# Patient Record
Sex: Male | Born: 1965 | Race: White | Hispanic: No | Marital: Married | Smoking: Never smoker
Health system: Southern US, Community
[De-identification: ages and names within clinical notes are randomized; demographics above are authoritative.]

## PROBLEM LIST (undated history)

## (undated) DIAGNOSIS — K219 Gastro-esophageal reflux disease without esophagitis: Secondary | ICD-10-CM

## (undated) DIAGNOSIS — G4730 Sleep apnea, unspecified: Secondary | ICD-10-CM

## (undated) DIAGNOSIS — I10 Essential (primary) hypertension: Secondary | ICD-10-CM

## (undated) DIAGNOSIS — E119 Type 2 diabetes mellitus without complications: Secondary | ICD-10-CM

## (undated) DIAGNOSIS — I209 Angina pectoris, unspecified: Secondary | ICD-10-CM

## (undated) DIAGNOSIS — M1990 Unspecified osteoarthritis, unspecified site: Secondary | ICD-10-CM

## (undated) HISTORY — PX: APPENDECTOMY: SHX54

## (undated) HISTORY — PX: BACK SURGERY: SHX140

## (undated) HISTORY — PX: SHOULDER SURGERY: SHX246

## (undated) HISTORY — PX: TONSILLECTOMY: SUR1361

## (undated) HISTORY — PX: JOINT REPLACEMENT: SHX530

## (undated) HISTORY — PX: OTHER SURGICAL HISTORY: SHX169

---

## 2002-11-19 ENCOUNTER — Emergency Department (HOSPITAL_COMMUNITY): Admission: EM | Admit: 2002-11-19 | Discharge: 2002-11-19 | Payer: Self-pay | Admitting: Emergency Medicine

## 2002-11-21 ENCOUNTER — Emergency Department (HOSPITAL_COMMUNITY): Admission: EM | Admit: 2002-11-21 | Discharge: 2002-11-21 | Payer: Self-pay | Admitting: Emergency Medicine

## 2002-11-26 ENCOUNTER — Emergency Department (HOSPITAL_COMMUNITY): Admission: EM | Admit: 2002-11-26 | Discharge: 2002-11-26 | Payer: Self-pay | Admitting: Emergency Medicine

## 2006-05-29 ENCOUNTER — Emergency Department (HOSPITAL_COMMUNITY): Admission: EM | Admit: 2006-05-29 | Discharge: 2006-05-29 | Payer: Self-pay | Admitting: Emergency Medicine

## 2006-07-23 ENCOUNTER — Ambulatory Visit (HOSPITAL_COMMUNITY): Admission: RE | Admit: 2006-07-23 | Discharge: 2006-07-23 | Payer: Self-pay | Admitting: Specialist

## 2007-07-09 ENCOUNTER — Ambulatory Visit (HOSPITAL_COMMUNITY): Admission: RE | Admit: 2007-07-09 | Discharge: 2007-07-10 | Payer: Self-pay | Admitting: Specialist

## 2009-07-27 ENCOUNTER — Ambulatory Visit (HOSPITAL_COMMUNITY): Admission: RE | Admit: 2009-07-27 | Discharge: 2009-07-27 | Payer: Self-pay | Admitting: Specialist

## 2009-09-30 ENCOUNTER — Ambulatory Visit (HOSPITAL_COMMUNITY): Admission: RE | Admit: 2009-09-30 | Discharge: 2009-10-01 | Payer: Self-pay | Admitting: Specialist

## 2010-03-21 ENCOUNTER — Inpatient Hospital Stay (HOSPITAL_COMMUNITY)
Admission: RE | Admit: 2010-03-21 | Discharge: 2010-03-22 | Payer: Self-pay | Source: Home / Self Care | Attending: Orthopedic Surgery | Admitting: Orthopedic Surgery

## 2010-05-24 NOTE — Op Note (Signed)
  NAME:  Frank Hensley, Frank Hensley NO.:  [id number]  MEDICAL RECORD NO.:  [id number]           PATIENT TYPE:  LOCATION:                                 FACILITY:  PHYSICIAN:  Erasmo Leventhal, M.D.DATE OF BIRTH:  [birth year]  DATE OF PROCEDURE: DATE OF DISCHARGE:                              OPERATIVE REPORT   ADDENDUM  Addendum to a surgical note, I dictated on July 2011.  After review of the operative report, there was an air.  Medial side was inspected and there was a posterior horn tear of his medial meniscus. This was debrided arthroscopically with basket and motorized shaver.          ______________________________ Erasmo Leventhal, M.D.     RAC/MEDQ  D:  05/10/2010  T:  05/11/2010  Job:  [id number]  Electronically Signed by Eugenia Mcalpine M.D. on 05/24/2010 05:18:08 PM

## 2010-06-05 LAB — GLUCOSE, CAPILLARY
Glucose-Capillary: 115 mg/dL — ABNORMAL HIGH (ref 70–99)
Glucose-Capillary: 125 mg/dL — ABNORMAL HIGH (ref 70–99)

## 2010-06-05 LAB — CBC
HCT: 34.9 % — ABNORMAL LOW (ref 39.0–52.0)
HCT: 39.7 % (ref 39.0–52.0)
Hemoglobin: 11.8 g/dL — ABNORMAL LOW (ref 13.0–17.0)
Hemoglobin: 13.6 g/dL (ref 13.0–17.0)
MCH: 28.4 pg (ref 26.0–34.0)
MCHC: 33.8 g/dL (ref 30.0–36.0)
WBC: 6.4 10*3/uL (ref 4.0–10.5)

## 2010-06-05 LAB — URINALYSIS, ROUTINE W REFLEX MICROSCOPIC
Bilirubin Urine: NEGATIVE
Hgb urine dipstick: NEGATIVE
Urobilinogen, UA: 0.2 mg/dL (ref 0.0–1.0)
pH: 7 (ref 5.0–8.0)

## 2010-06-05 LAB — DIFFERENTIAL
Basophils Relative: 0 % (ref 0–1)
Eosinophils Absolute: 0.2 10*3/uL (ref 0.0–0.7)
Eosinophils Relative: 3 % (ref 0–5)
Monocytes Absolute: 0.6 10*3/uL (ref 0.1–1.0)
Monocytes Relative: 9 % (ref 3–12)
Neutro Abs: 4 10*3/uL (ref 1.7–7.7)
Neutrophils Relative %: 62 % (ref 43–77)

## 2010-06-05 LAB — BASIC METABOLIC PANEL
Calcium: 9.2 mg/dL (ref 8.4–10.5)
Chloride: 106 mEq/L (ref 96–112)
Creatinine, Ser: 1.02 mg/dL (ref 0.4–1.5)
GFR calc Af Amer: >60 mL/min (ref >60)
GFR calc non Af Amer: >60 mL/min (ref >60)
GFR calc non Af Amer: >60 mL/min (ref >60)
Glucose, Bld: 126 mg/dL — ABNORMAL HIGH (ref 70–99)
Potassium: 3.8 mEq/L (ref 3.5–5.1)
Potassium: 4.4 mEq/L (ref 3.5–5.1)
Sodium: 139 mEq/L (ref 135–145)
Sodium: 140 mEq/L (ref 135–145)

## 2010-06-05 LAB — TYPE AND SCREEN: Antibody Screen: NEGATIVE

## 2010-06-05 LAB — SURGICAL PCR SCREEN: Staphylococcus aureus: POSITIVE — AB

## 2010-06-11 LAB — SURGICAL PCR SCREEN: MRSA, PCR: NEGATIVE

## 2010-06-11 LAB — DIFFERENTIAL
Basophils Relative: 1 % (ref 0–1)
Eosinophils Absolute: 0.1 10*3/uL (ref 0.0–0.7)
Monocytes Absolute: 0.6 10*3/uL (ref 0.1–1.0)
Monocytes Relative: 9 % (ref 3–12)

## 2010-06-11 LAB — COMPREHENSIVE METABOLIC PANEL
ALT: 32 U/L (ref 0–53)
Albumin: 4 g/dL (ref 3.5–5.2)
Alkaline Phosphatase: 85 U/L (ref 39–117)
GFR calc Af Amer: >60 mL/min (ref >60)
Potassium: 3.8 mEq/L (ref 3.5–5.1)
Sodium: 140 mEq/L (ref 135–145)
Total Protein: 7.2 g/dL (ref 6.0–8.3)

## 2010-06-11 LAB — URINALYSIS, ROUTINE W REFLEX MICROSCOPIC
Glucose, UA: NEGATIVE mg/dL
Nitrite: NEGATIVE
Protein, ur: NEGATIVE mg/dL
pH: 5.5 (ref 5.0–8.0)

## 2010-06-11 LAB — CBC
Platelets: 169 10*3/uL (ref 150–400)
RDW: 13.5 % (ref 11.5–15.5)
WBC: 6.4 10*3/uL (ref 4.0–10.5)

## 2010-06-11 LAB — APTT: aPTT: 26 seconds (ref 24–37)

## 2010-06-11 LAB — GLUCOSE, CAPILLARY: Glucose-Capillary: 100 mg/dL — ABNORMAL HIGH (ref 70–99)

## 2010-06-13 LAB — COMPREHENSIVE METABOLIC PANEL
Albumin: 3.8 g/dL (ref 3.5–5.2)
Alkaline Phosphatase: 113 U/L (ref 39–117)
BUN: 11 mg/dL (ref 6–23)
Calcium: 9.2 mg/dL (ref 8.4–10.5)
Creatinine, Ser: 1.11 mg/dL (ref 0.4–1.5)
Glucose, Bld: 320 mg/dL — ABNORMAL HIGH (ref 70–99)
Potassium: 4 mEq/L (ref 3.5–5.1)
Total Protein: 7.1 g/dL (ref 6.0–8.3)

## 2010-06-13 LAB — DIFFERENTIAL
Lymphocytes Relative: 26 % (ref 12–46)
Lymphs Abs: 1.4 10*3/uL (ref 0.7–4.0)
Monocytes Absolute: 0.5 10*3/uL (ref 0.1–1.0)
Monocytes Relative: 9 % (ref 3–12)
Neutro Abs: 3.4 10*3/uL (ref 1.7–7.7)
Neutrophils Relative %: 63 % (ref 43–77)

## 2010-06-13 LAB — APTT: aPTT: 26 seconds (ref 24–37)

## 2010-06-13 LAB — URINALYSIS, ROUTINE W REFLEX MICROSCOPIC
Bilirubin Urine: NEGATIVE
Leukocytes, UA: NEGATIVE
Nitrite: NEGATIVE
Specific Gravity, Urine: 1.031 — ABNORMAL HIGH (ref 1.005–1.030)
Urobilinogen, UA: 0.2 mg/dL (ref 0.0–1.0)
pH: 5.5 (ref 5.0–8.0)

## 2010-06-13 LAB — CBC
HCT: 41.1 % (ref 39.0–52.0)
MCHC: 34.3 g/dL (ref 30.0–36.0)
Platelets: 150 10*3/uL (ref 150–400)
RDW: 13.8 % (ref 11.5–15.5)

## 2010-06-13 LAB — PROTIME-INR: INR: 1.01 (ref 0.00–1.49)
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
## 2010-08-11 NOTE — Op Note (Signed)
NAME:  Frank Hensley, Frank Hensley NO.:  [id number]   MEDICAL RECORD NO.:  [id number]          PATIENT TYPE:  AMB   LOCATION:  DAY                          FACILITY:  [facility]   PHYSICIAN:  Erasmo Leventhal, M.D.DATE OF BIRTH:  November 21, 1965   DATE OF PROCEDURE:  07/23/2006  DATE OF DISCHARGE:                               OPERATIVE REPORT   PREOPERATIVE DIAGNOSIS:  Left knee probable torn meniscus,  chondromalacia, possible loose bodies.   POSTOPERATIVE DIAGNOSIS:  Left knee medial meniscal tear, chondral loose  bodies, grade III chondromalacia, patellofemoral joint, grade III  femoral condyle, and grade 3-4 lateral compartment.   PROCEDURE:  Left knee arthroscopic posterior medial meniscectomy,  chondroplasty of patellofemoral joint, chondroplasty of the medial  compartment, chondroplasty of the lateral compartment, removal of  multiple chondral loose bodies.   SURGEON:  Erasmo Leventhal, M.D.   ASSISTANT:  Jaquelyn Bitter. Chabon, PA-C.   ANESTHESIA:  Knee block with monitored anesthesia care, Dr. Pollyann Kennedy.   ESTIMATED BLOOD LOSS:  Less than 10 cc.   DRAINS:  None.   COMPLICATIONS:  None.   TOURNIQUET TIME:  None.   DISPOSITION:  PACU stable.   OPERATIVE DETAILS:  Patient counseled in the holding area.  The correct  side was identified.  IV was started.  Antibiotics were given.  Local  block was administered.  Patient was taken to the operating room, placed  in supine position.  Monitored anesthesia care.  Prepped with DuraPrep.  Draped in a sterile fashion.  Anesthetized.  I added 20 cc of 1%  lidocaine with epinephrine to the local knee block.  To supplement, he  was given monitored anesthesia care.  A medial portal was established.  Anteromedial and anterolateral.  Diagnostic arthroscopy revealed  multiple chondral loose bodies.  These were sequentially removed.  The  patellofemoral joint revealed normal tracking with a grade III  chondromalacia.  Mechanical  chondroplasty was performed with a  mechanical shaver back to a stable base.  ACL and PCL were intact.  The  lateral side was inspected.  The meniscus was intact.  A grade 3-4  chondromalacia to the lateral tibial plateau and a grade 3-4 lateral  femoral condyle.  Mechanical chondroplasty was performed back to stable  base.  The medial side was inspected.  Grade 3 chondromalacia to the  medial femoral condyle, chondroplasty back to stable base.  Medial  meniscal tear was present.  Utilizing motorized shaver, partial medial  meniscectomy was performed back to stable base, well-beveled and  contoured.  Then took the [employer] Mini Vac system, gently coagulated  the periphery, being careful to touch the meniscus only and not the  articular cartilage.  The knee was then sequentially reinspected.  There  were no other abnormalities noted.  After copious irrigation, all  arthroscopic equipment was removed.  The three portals were closed with  4-0 nylon suture.  At the end of the case, 20 cc of 1.25% Marcaine with  epinephrine and 4 mg of sulfate was injected into the knee for pain with  hemostasis.  Sterile dressing applied to the knee.  He tolerated the  procedure with no complications.  He was gently awakened and taken to  the operating room and PACU in stable condition.   To help with surgical technique and leg holding due to the patient's  morbid obesity, Mr. Leilani Able, PA-C's, assistance, was needed  throughout the entire case.           ______________________________  Erasmo Leventhal, M.D.     RAC/MEDQ  D:  07/23/2006  T:  07/24/2006  Job:  [id number]

## 2010-12-19 LAB — COMPREHENSIVE METABOLIC PANEL
ALT: 32
AST: 26
Albumin: 3.7
CO2: 26
Calcium: 9.3
Chloride: 108
Creatinine, Ser: 0.97
GFR calc Af Amer: >60
Sodium: 141

## 2010-12-19 LAB — CBC
HCT: 36.4 — ABNORMAL LOW
MCHC: 35.1
MCV: 81.1
MCV: 80.6
Platelets: 185
RBC: 5.06
RBC: 4.52
WBC: 5.8
WBC: 7

## 2010-12-19 LAB — URINALYSIS, ROUTINE W REFLEX MICROSCOPIC
Glucose, UA: 100 — AB
Nitrite: NEGATIVE
Specific Gravity, Urine: 1.015
pH: 6

## 2010-12-19 LAB — DIFFERENTIAL
Eosinophils Absolute: 0.2
Eosinophils Relative: 3
Lymphocytes Relative: 26
Lymphs Abs: 1.5
Monocytes Absolute: 0.5

## 2010-12-19 LAB — BASIC METABOLIC PANEL
Chloride: 107
GFR calc Af Amer: >60
GFR calc non Af Amer: >60
Potassium: 3.5
Sodium: 140

## 2011-08-24 ENCOUNTER — Other Ambulatory Visit: Payer: Self-pay | Admitting: Orthopedic Surgery

## 2011-08-24 ENCOUNTER — Ambulatory Visit
Admission: RE | Admit: 2011-08-24 | Discharge: 2011-08-24 | Disposition: A | Discharge status: Home / Self Care | Payer: Worker's Compensation | Source: Ambulatory Visit | Attending: Orthopedic Surgery | Admitting: Orthopedic Surgery

## 2011-08-24 DIAGNOSIS — M25569 Pain in unspecified knee: Secondary | ICD-10-CM

## 2013-12-03 ENCOUNTER — Ambulatory Visit: Payer: Self-pay | Admitting: Orthopedic Surgery

## 2013-12-11 ENCOUNTER — Ambulatory Visit: Payer: Self-pay | Admitting: Orthopedic Surgery

## 2013-12-18 ENCOUNTER — Encounter (HOSPITAL_COMMUNITY): Payer: Self-pay | Admitting: Pharmacy Technician

## 2013-12-22 ENCOUNTER — Other Ambulatory Visit (HOSPITAL_COMMUNITY): Payer: Self-pay | Admitting: *Deleted

## 2013-12-22 NOTE — Patient Instructions (Addendum)
20     Your procedure is scheduled on:  Wednesday 12/30/2013  [facility] and follow signs to Short Stay  at  0630 AM.  Call this number if you have problems the night before or morning of surgery:  [phone]   Remember: DO NOT TAKE ANY DIABETIC MEDICATIONS MORNING OF SURGERY!          Do not eat food or drink liquids AFTER MIDNIGHT!  Take these medicines the morning of surgery with A SIP OF WATER: Gabapentin    [facility] IS NOT RESPONSIBLE FOR ANY BELONGINGS OR VALUABLES BROUGHT TO HOSPITAL.  Marland Kitchen.  Leave suitcase in the car. After surgery it may be brought to your room.  For patients admitted to the hospital, checkout time is 11:00 AM the day of              Discharge.    DO NOT WEAR  JEWELRY,MAKE-UP,LOTIONS,POWDERS,PERFUMES,CONTACTS , DENTURES OR BRIDGEWORK ,AND DO NOT WEAR FALSE EYELASHES                                    Patients discharged the day of surgery will not be allowed to drive home.  If going home the same day of surgery, must have someone stay with you  first 24 hrs.at home and arrange for someone to drive you home from the Hospital.                         YOUR DRIVER IS: N/A   Special Instructions:              Please read over the following fact sheets that you were given:             1. [facility] PREPARING FOR SURGERY SHEET              2.[facility]                                                        Center - Preparing for Surgery Before surgery, you can play an important role.  Because skin is not sterile, your skin needs to be as free of germs as possible.  You can reduce the number of germs on your skin by washing with CHG (chlorahexidine gluconate) soap before surgery.  CHG is an antiseptic cleaner which kills germs and bonds with the skin to continue killing germs even after washing. Please DO NOT use if you have an allergy to CHG or antibacterial soaps.  If your skin becomes reddened/irritated stop using  the CHG and inform your nurse when you arrive at Short Stay. Do not shave (including legs and underarms) for at least 48 hours prior to the first CHG shower.  You may shave your face/neck. Please follow these instructions carefully:  1.  Shower with CHG Soap the night before surgery and the  morning of Surgery.  2.  If you choose to wash your hair, wash your hair first as usual with your  normal  shampoo.  3.  After you shampoo, rinse your hair and body thoroughly to remove the  shampoo.  4.  Use CHG as you would any other liquid soap.  You can apply chg directly  to the skin and wash                       Gently with a scrungie or clean washcloth.  5.  Apply the CHG Soap to your body ONLY FROM THE NECK DOWN.   Do not use on face/ open                           Wound or open sores. Avoid contact with eyes, ears mouth and genitals (private parts).                       Wash face,  Genitals (private parts) with your normal soap.             6.  Wash thoroughly, paying special attention to the area where your surgery  will be performed.  7.  Thoroughly rinse your body with warm water from the neck down.  8.  DO NOT shower/wash with your normal soap after using and rinsing off  the CHG Soap.                9.  Pat yourself dry with a clean towel.            10.  Wear clean pajamas.            11.  Place clean sheets on your bed the night of your first shower and do not  sleep with pets. Day of Surgery : Do not apply any lotions/deodorants the morning of surgery.  Please wear clean clothes to the hospital/surgery center.  FAILURE TO FOLLOW THESE INSTRUCTIONS MAY RESULT IN THE CANCELLATION OF YOUR SURGERY PATIENT SIGNATURE_________________________________  NURSE SIGNATURE__________________________________  ________________________________________________________________________   Frank Hensley  An incentive spirometer is a tool that can help keep your lungs  clear and active. This tool measures how well you are filling your lungs with each breath. Taking long deep breaths may help reverse or decrease the chance of developing breathing (pulmonary) problems (especially infection) following:  A long period of time when you are unable to move or be active. BEFORE THE PROCEDURE   If the spirometer includes an indicator to show your best effort, your nurse or respiratory therapist will set it to a desired goal.  If possible, sit up straight or lean slightly forward. Try not to slouch.  Hold the incentive spirometer in an upright position. INSTRUCTIONS FOR USE  1. Sit on the edge of your bed if possible, or sit up as far as you can in bed or on a chair. 2. Hold the incentive spirometer in an upright position. 3. Breathe out normally. 4. Place the mouthpiece in your mouth and seal your lips tightly around it. 5. Breathe in slowly and as deeply as possible, raising the piston or the ball toward the top of the column. 6. Hold your breath for 3-5 seconds or for as long as possible. Allow the piston or ball to fall to the bottom of the column. 7. Remove the mouthpiece from your mouth and breathe out normally. 8. Rest for a few seconds and repeat Steps 1 through 7 at least 10 times every 1-2 hours when you are awake. Take your time and take a few normal breaths between deep breaths. 9. The spirometer may include an indicator to show  your best effort. Use the indicator as a goal to work toward during each repetition. 10. After each set of 10 deep breaths, practice coughing to be sure your lungs are clear. If you have an incision (the cut made at the time of surgery), support your incision when coughing by placing a pillow or rolled up towels firmly against it. Once you are able to get out of bed, walk around indoors and cough well. You may stop using the incentive spirometer when instructed by your caregiver.  RISKS AND COMPLICATIONS  Take your time so you do  not get dizzy or light-headed.  If you are in pain, you may need to take or ask for pain medication before doing incentive spirometry. It is harder to take a deep breath if you are having pain. AFTER USE  Rest and breathe slowly and easily.  It can be helpful to keep track of a log of your progress. Your caregiver can provide you with a simple table to help with this. If you are using the spirometer at home, follow these instructions: SEEK MEDICAL CARE IF:   You are having difficultly using the spirometer.  You have trouble using the spirometer as often as instructed.  Your pain medication is not giving enough relief while using the spirometer.  You develop fever of 100.5 F (38.1 C) or higher. SEEK IMMEDIATE MEDICAL CARE IF:   You cough up bloody sputum that had not been present before.  You develop fever of 102 F (38.9 C) or greater.  You develop worsening pain at or near the incision site. MAKE SURE YOU:   Understand these instructions.  Will watch your condition.  Will get help right away if you are not doing well or get worse. Document Released: 07/23/2006 Document Revised: 06/04/2011 Document Reviewed: 09/23/2006 [facility] Patient Information 2014 [place], [place].   ________________________________________________________________________

## 2013-12-23 ENCOUNTER — Encounter (INDEPENDENT_AMBULATORY_CARE_PROVIDER_SITE_OTHER): Payer: Self-pay

## 2013-12-23 ENCOUNTER — Ambulatory Visit (HOSPITAL_COMMUNITY)
Admission: RE | Admit: 2013-12-23 | Discharge: 2013-12-23 | Disposition: A | Discharge status: Home / Self Care | Payer: Worker's Compensation | Source: Ambulatory Visit | Attending: Orthopedic Surgery | Admitting: Orthopedic Surgery

## 2013-12-23 ENCOUNTER — Ambulatory Visit: Payer: Self-pay | Admitting: Orthopedic Surgery

## 2013-12-23 ENCOUNTER — Ambulatory Visit (HOSPITAL_COMMUNITY)
Admission: RE | Admit: 2013-12-23 | Discharge: 2013-12-23 | Disposition: A | Discharge status: Home / Self Care | Payer: Worker's Compensation | Source: Ambulatory Visit | Attending: Anesthesiology | Admitting: Anesthesiology

## 2013-12-23 ENCOUNTER — Encounter (HOSPITAL_COMMUNITY)
Admission: RE | Admit: 2013-12-23 | Discharge: 2013-12-23 | Disposition: A | Discharge status: Home / Self Care | Payer: Worker's Compensation | Source: Ambulatory Visit | Attending: Orthopedic Surgery | Admitting: Orthopedic Surgery

## 2013-12-23 ENCOUNTER — Encounter (HOSPITAL_COMMUNITY): Payer: Self-pay

## 2013-12-23 DIAGNOSIS — IMO0002 Reserved for concepts with insufficient information to code with codable children: Secondary | ICD-10-CM | POA: Diagnosis not present

## 2013-12-23 DIAGNOSIS — I10 Essential (primary) hypertension: Secondary | ICD-10-CM | POA: Diagnosis not present

## 2013-12-23 DIAGNOSIS — X58XXXA Exposure to other specified factors, initial encounter: Secondary | ICD-10-CM | POA: Diagnosis not present

## 2013-12-23 DIAGNOSIS — M5137 Other intervertebral disc degeneration, lumbosacral region: Secondary | ICD-10-CM | POA: Insufficient documentation

## 2013-12-23 DIAGNOSIS — M4120 Other idiopathic scoliosis, site unspecified: Secondary | ICD-10-CM | POA: Diagnosis not present

## 2013-12-23 DIAGNOSIS — E119 Type 2 diabetes mellitus without complications: Secondary | ICD-10-CM | POA: Diagnosis not present

## 2013-12-23 DIAGNOSIS — Z01818 Encounter for other preprocedural examination: Secondary | ICD-10-CM | POA: Diagnosis not present

## 2013-12-23 DIAGNOSIS — M51379 Other intervertebral disc degeneration, lumbosacral region without mention of lumbar back pain or lower extremity pain: Secondary | ICD-10-CM | POA: Insufficient documentation

## 2013-12-23 DIAGNOSIS — K219 Gastro-esophageal reflux disease without esophagitis: Secondary | ICD-10-CM | POA: Diagnosis not present

## 2013-12-23 HISTORY — DX: Type 2 diabetes mellitus without complications: E11.9

## 2013-12-23 HISTORY — DX: Gastro-esophageal reflux disease without esophagitis: K21.9

## 2013-12-23 LAB — BASIC METABOLIC PANEL
ANION GAP: 14 (ref 5–15)
BUN: 9 mg/dL (ref 6–23)
CALCIUM: 9.5 mg/dL (ref 8.4–10.5)
CO2: 23 meq/L (ref 19–32)
CREATININE: 0.95 mg/dL (ref 0.50–1.35)
Chloride: 103 mEq/L (ref 96–112)
GFR calc Af Amer: >90 mL/min (ref >90)
GFR calc non Af Amer: >90 mL/min (ref >90)
Glucose, Bld: 140 mg/dL — ABNORMAL HIGH (ref 70–99)
Potassium: 4.1 mEq/L (ref 3.7–5.3)
Sodium: 140 mEq/L (ref 137–147)

## 2013-12-23 LAB — CBC
HEMATOCRIT: 41.4 % (ref 39.0–52.0)
Hemoglobin: 14.3 g/dL (ref 13.0–17.0)
MCH: 27.9 pg (ref 26.0–34.0)
MCHC: 34.5 g/dL (ref 30.0–36.0)
MCV: 80.7 fL (ref 78.0–100.0)
Platelets: 166 10*3/uL (ref 150–400)
RBC: 5.13 MIL/uL (ref 4.22–5.81)
RDW: 13.1 % (ref 11.5–15.5)
WBC: 5.9 10*3/uL (ref 4.0–10.5)

## 2013-12-23 LAB — SURGICAL PCR SCREEN
MRSA, PCR: NEGATIVE
STAPHYLOCOCCUS AUREUS: POSITIVE — AB
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
## 2013-12-23 NOTE — Progress Notes (Signed)
EKG on chart but of very poor quality from Com[employer]Dr.Angel Brown and last office visit from 11/19/2013 on chart

## 2013-12-25 ENCOUNTER — Ambulatory Visit: Payer: Self-pay | Admitting: Orthopedic Surgery

## 2013-12-28 ENCOUNTER — Ambulatory Visit: Payer: Self-pay | Admitting: Orthopedic Surgery

## 2013-12-29 ENCOUNTER — Ambulatory Visit: Payer: Self-pay | Admitting: Orthopedic Surgery

## 2013-12-29 NOTE — Anesthesia Preprocedure Evaluation (Addendum)
Anesthesia Evaluation  Patient identified by MRN, date of birth, ID band Patient awake    Reviewed: Allergy & Precautions, H&P , NPO status , Patient's Chart, lab work & pertinent test results  History of Anesthesia Complications Negative for: history of anesthetic complications  Airway Mallampati: III TM Distance: >3 FB Neck ROM: Full    Dental no notable dental hx. (+) Teeth Intact, Dental Advisory Given   Pulmonary sleep apnea ,  breath sounds clear to auscultation  Pulmonary exam normal       Cardiovascular Exercise Tolerance: Good negative cardio ROS  Rhythm:Regular Rate:Normal  Reports 50lb weight loss in one year and exercises regularly without any problems    Neuro/Psych negative neurological ROS  negative psych ROS   GI/Hepatic Neg liver ROS, GERD-  Medicated and Controlled,  Endo/Other  diabetes, Well Controlled, Type 2, Oral Hypoglycemic AgentsMorbid obesity  Renal/GU negative Renal ROS  negative genitourinary   Musculoskeletal negative musculoskeletal ROS (+)   Abdominal   Peds negative pediatric ROS (+)  Hematology negative hematology ROS (+)   Anesthesia Other Findings   Reproductive/Obstetrics negative OB ROS                          Anesthesia Physical Anesthesia Plan  ASA: III  Anesthesia Plan: General   Post-op Pain Management:    Induction: Intravenous  Airway Management Planned: Oral ETT  Additional Equipment:   Intra-op Plan:   Post-operative Plan: Extubation in OR  Informed Consent: I have reviewed the patients History and Physical, chart, labs and discussed the procedure including the risks, benefits and alternatives for the proposed anesthesia with the patient or authorized representative who has indicated his/her understanding and acceptance.   Dental advisory given  Plan Discussed with: CRNA  Anesthesia Plan Comments:         Anesthesia  Quick Evaluation

## 2013-12-30 ENCOUNTER — Encounter (HOSPITAL_COMMUNITY): Payer: Self-pay | Admitting: *Deleted

## 2013-12-30 ENCOUNTER — Ambulatory Visit (HOSPITAL_COMMUNITY): Payer: Worker's Compensation | Admitting: Anesthesiology

## 2013-12-30 ENCOUNTER — Ambulatory Visit (HOSPITAL_COMMUNITY): Payer: Worker's Compensation

## 2013-12-30 ENCOUNTER — Encounter (HOSPITAL_COMMUNITY): Payer: Worker's Compensation | Admitting: Anesthesiology

## 2013-12-30 ENCOUNTER — Encounter (HOSPITAL_COMMUNITY)
Admission: RE | Disposition: A | Discharge status: Home / Self Care | Payer: Self-pay | Source: Ambulatory Visit | Attending: Specialist

## 2013-12-30 ENCOUNTER — Ambulatory Visit (HOSPITAL_COMMUNITY)
Admission: RE | Admit: 2013-12-30 | Discharge: 2014-01-01 | Disposition: A | Discharge status: Home / Self Care | Payer: Worker's Compensation | Source: Ambulatory Visit | Attending: Specialist | Admitting: Specialist

## 2013-12-30 DIAGNOSIS — K219 Gastro-esophageal reflux disease without esophagitis: Secondary | ICD-10-CM | POA: Insufficient documentation

## 2013-12-30 DIAGNOSIS — Z6841 Body Mass Index (BMI) 40.0 and over, adult: Secondary | ICD-10-CM | POA: Insufficient documentation

## 2013-12-30 DIAGNOSIS — M419 Scoliosis, unspecified: Secondary | ICD-10-CM | POA: Insufficient documentation

## 2013-12-30 DIAGNOSIS — M4806 Spinal stenosis, lumbar region: Secondary | ICD-10-CM | POA: Insufficient documentation

## 2013-12-30 DIAGNOSIS — M4800 Spinal stenosis, site unspecified: Secondary | ICD-10-CM

## 2013-12-30 DIAGNOSIS — E119 Type 2 diabetes mellitus without complications: Secondary | ICD-10-CM | POA: Insufficient documentation

## 2013-12-30 DIAGNOSIS — M48061 Spinal stenosis, lumbar region without neurogenic claudication: Secondary | ICD-10-CM

## 2013-12-30 HISTORY — PX: LUMBAR LAMINECTOMY/DECOMPRESSION MICRODISCECTOMY: SHX5026

## 2013-12-30 LAB — GLUCOSE, CAPILLARY
GLUCOSE-CAPILLARY: 182 mg/dL — AB (ref 70–99)
GLUCOSE-CAPILLARY: 128 mg/dL — AB (ref 70–99)
Glucose-Capillary: 164 mg/dL — ABNORMAL HIGH (ref 70–99)
Glucose-Capillary: 157 mg/dL — ABNORMAL HIGH (ref 70–99)

## 2013-12-30 LAB — TYPE AND SCREEN
ABO/RH(D): O POS
Antibody Screen: NEGATIVE

## 2013-12-30 SURGERY — LUMBAR LAMINECTOMY/DECOMPRESSION MICRODISCECTOMY 3 LEVELS
Anesthesia: General | Site: Back | Laterality: Left

## 2013-12-30 MED ORDER — METHOCARBAMOL 1000 MG/10ML IJ SOLN
500.0000 mg | Freq: Four times a day (QID) | INTRAVENOUS | Status: DC | PRN
  Administered 2013-12-30: 500 mg via INTRAVENOUS
  Filled 2013-12-30: qty 5

## 2013-12-30 MED ORDER — GLYCOPYRROLATE 0.2 MG/ML IJ SOLN
INTRAMUSCULAR | Status: AC
  Filled 2013-12-30: qty 3

## 2013-12-30 MED ORDER — MUPIROCIN 2 % EX OINT
TOPICAL_OINTMENT | CUTANEOUS | Status: AC
  Filled 2013-12-30: qty 22

## 2013-12-30 MED ORDER — SODIUM CHLORIDE 0.9 % IJ SOLN
3.0000 mL | Freq: Two times a day (BID) | INTRAMUSCULAR | Status: DC
  Administered 2013-12-31 – 2014-01-01 (×3): 3 mL via INTRAVENOUS

## 2013-12-30 MED ORDER — THROMBIN 5000 UNITS EX SOLR
CUTANEOUS | Status: DC | PRN
  Administered 2013-12-30: 10000 [IU] via TOPICAL

## 2013-12-30 MED ORDER — PROMETHAZINE HCL 25 MG/ML IJ SOLN: 6.2500 mg | INTRAMUSCULAR | Status: DC | PRN

## 2013-12-30 MED ORDER — SODIUM CHLORIDE 0.9 % IV SOLN: 250.0000 mL | INTRAVENOUS | Status: DC

## 2013-12-30 MED ORDER — ONDANSETRON HCL 4 MG/2ML IJ SOLN
INTRAMUSCULAR | Status: DC | PRN
  Administered 2013-12-30: 4 mg via INTRAVENOUS

## 2013-12-30 MED ORDER — LACTATED RINGERS IV SOLN
INTRAVENOUS | Status: DC
  Administered 2013-12-30: 09:00:00 via INTRAVENOUS

## 2013-12-30 MED ORDER — LIDOCAINE HCL (CARDIAC) 20 MG/ML IV SOLN
INTRAVENOUS | Status: DC | PRN
  Administered 2013-12-30: 50 mg via INTRAVENOUS

## 2013-12-30 MED ORDER — DEXAMETHASONE SODIUM PHOSPHATE 10 MG/ML IJ SOLN
INTRAMUSCULAR | Status: AC
  Filled 2013-12-30: qty 1

## 2013-12-30 MED ORDER — BUPIVACAINE-EPINEPHRINE (PF) 0.5% -1:200000 IJ SOLN
INTRAMUSCULAR | Status: AC
  Filled 2013-12-30: qty 30

## 2013-12-30 MED ORDER — METHOCARBAMOL 500 MG PO TABS
500.0000 mg | ORAL_TABLET | Freq: Four times a day (QID) | ORAL | Status: DC | PRN
  Administered 2013-12-30 – 2014-01-01 (×7): 500 mg via ORAL
  Filled 2013-12-30 (×7): qty 1

## 2013-12-30 MED ORDER — FENTANYL CITRATE 0.05 MG/ML IJ SOLN
INTRAMUSCULAR | Status: AC
  Filled 2013-12-30: qty 5

## 2013-12-30 MED ORDER — SENNOSIDES-DOCUSATE SODIUM 8.6-50 MG PO TABS: 1.0000 | ORAL_TABLET | Freq: Every evening | ORAL | Status: DC | PRN

## 2013-12-30 MED ORDER — OXYCODONE-ACETAMINOPHEN 5-325 MG PO TABS
1.0000 | ORAL_TABLET | ORAL | Status: DC | PRN
  Administered 2013-12-31 – 2014-01-01 (×5): 2 via ORAL
  Filled 2013-12-30 (×5): qty 2

## 2013-12-30 MED ORDER — INSULIN ASPART 100 UNIT/ML ~~LOC~~ SOLN
0.0000 [IU] | Freq: Three times a day (TID) | SUBCUTANEOUS | Status: DC
  Administered 2013-12-31 (×2): 4 [IU] via SUBCUTANEOUS
  Administered 2013-12-31: 3 [IU] via SUBCUTANEOUS
  Administered 2014-01-01: 4 [IU] via SUBCUTANEOUS

## 2013-12-30 MED ORDER — MIDAZOLAM HCL 2 MG/2ML IJ SOLN
INTRAMUSCULAR | Status: AC
  Filled 2013-12-30: qty 2

## 2013-12-30 MED ORDER — BISACODYL 5 MG PO TBEC
5.0000 mg | DELAYED_RELEASE_TABLET | Freq: Every day | ORAL | Status: DC | PRN
Start: 2013-12-30 — End: 2014-01-01

## 2013-12-30 MED ORDER — DEXTROSE 5 % IV SOLN
3.0000 g | INTRAVENOUS | Status: AC
  Administered 2013-12-30: 3 g via INTRAVENOUS
  Filled 2013-12-30: qty 3000

## 2013-12-30 MED ORDER — METHOCARBAMOL 500 MG PO TABS: 500.0000 mg | ORAL_TABLET | Freq: Three times a day (TID) | ORAL | Status: DC | PRN

## 2013-12-30 MED ORDER — ACETAMINOPHEN 650 MG RE SUPP
650.0000 mg | RECTAL | Status: DC | PRN
Start: 2013-12-30 — End: 2014-01-01

## 2013-12-30 MED ORDER — MEPERIDINE HCL 50 MG/ML IJ SOLN: 6.2500 mg | INTRAMUSCULAR | Status: DC | PRN

## 2013-12-30 MED ORDER — ONDANSETRON HCL 4 MG/2ML IJ SOLN
INTRAMUSCULAR | Status: AC
  Filled 2013-12-30: qty 2

## 2013-12-30 MED ORDER — FENTANYL CITRATE 0.05 MG/ML IJ SOLN
INTRAMUSCULAR | Status: DC | PRN
  Administered 2013-12-30 (×6): 50 ug via INTRAVENOUS
  Administered 2013-12-30: 100 ug via INTRAVENOUS
  Administered 2013-12-30: 50 ug via INTRAVENOUS

## 2013-12-30 MED ORDER — BUPIVACAINE-EPINEPHRINE (PF) 0.5% -1:200000 IJ SOLN
INTRAMUSCULAR | Status: DC | PRN
  Administered 2013-12-30: 15 mL

## 2013-12-30 MED ORDER — ALUM & MAG HYDROXIDE-SIMETH 200-200-20 MG/5ML PO SUSP
30.0000 mL | Freq: Four times a day (QID) | ORAL | Status: DC | PRN
  Administered 2013-12-31: 30 mL via ORAL
  Filled 2013-12-30: qty 30

## 2013-12-30 MED ORDER — LISINOPRIL 20 MG PO TABS
20.0000 mg | ORAL_TABLET | Freq: Every day | ORAL | Status: DC
  Administered 2013-12-30 – 2014-01-01 (×3): 20 mg via ORAL
  Filled 2013-12-30 (×3): qty 1

## 2013-12-30 MED ORDER — NEOSTIGMINE METHYLSULFATE 10 MG/10ML IV SOLN
INTRAVENOUS | Status: AC
  Filled 2013-12-30: qty 1

## 2013-12-30 MED ORDER — LIDOCAINE HCL (CARDIAC) 20 MG/ML IV SOLN
INTRAVENOUS | Status: AC
  Filled 2013-12-30: qty 5

## 2013-12-30 MED ORDER — MIDAZOLAM HCL 5 MG/5ML IJ SOLN
INTRAMUSCULAR | Status: DC | PRN
  Administered 2013-12-30: 2 mg via INTRAVENOUS

## 2013-12-30 MED ORDER — PROPOFOL 10 MG/ML IV BOLUS
INTRAVENOUS | Status: AC
  Filled 2013-12-30: qty 20

## 2013-12-30 MED ORDER — PHENOL 1.4 % MT LIQD: 1.0000 | OROMUCOSAL | Status: DC | PRN

## 2013-12-30 MED ORDER — OXYCODONE-ACETAMINOPHEN 7.5-325 MG PO TABS: 1.0000 | ORAL_TABLET | ORAL | Status: DC | PRN

## 2013-12-30 MED ORDER — ROCURONIUM BROMIDE 100 MG/10ML IV SOLN
INTRAVENOUS | Status: DC | PRN
  Administered 2013-12-30: 50 mg via INTRAVENOUS
  Administered 2013-12-30: 10 mg via INTRAVENOUS
  Administered 2013-12-30: 5 mg via INTRAVENOUS

## 2013-12-30 MED ORDER — SODIUM CHLORIDE 0.9 % IR SOLN
Status: DC | PRN
  Administered 2013-12-30: 09:00:00

## 2013-12-30 MED ORDER — DEXTROSE 5 % IV SOLN
3.0000 g | Freq: Three times a day (TID) | INTRAVENOUS | Status: AC
  Administered 2013-12-30 – 2013-12-31 (×3): 3 g via INTRAVENOUS
  Filled 2013-12-30 (×3): qty 3000

## 2013-12-30 MED ORDER — GABAPENTIN 300 MG PO CAPS
900.0000 mg | ORAL_CAPSULE | Freq: Three times a day (TID) | ORAL | Status: DC
  Administered 2013-12-30 – 2014-01-01 (×5): 900 mg via ORAL
  Filled 2013-12-30 (×8): qty 3

## 2013-12-30 MED ORDER — MAGNESIUM CITRATE PO SOLN: 1.0000 | Freq: Once | ORAL | Status: AC | PRN

## 2013-12-30 MED ORDER — ONDANSETRON HCL 4 MG/2ML IJ SOLN
4.0000 mg | INTRAMUSCULAR | Status: DC | PRN
  Administered 2013-12-31: 4 mg via INTRAVENOUS
  Filled 2013-12-30: qty 2

## 2013-12-30 MED ORDER — PROPOFOL 10 MG/ML IV BOLUS
INTRAVENOUS | Status: DC | PRN
  Administered 2013-12-30: 200 mg via INTRAVENOUS

## 2013-12-30 MED ORDER — CLINDAMYCIN PHOSPHATE 900 MG/50ML IV SOLN
INTRAVENOUS | Status: AC
  Filled 2013-12-30: qty 50

## 2013-12-30 MED ORDER — MENTHOL 3 MG MT LOZG: 1.0000 | LOZENGE | OROMUCOSAL | Status: DC | PRN

## 2013-12-30 MED ORDER — KCL IN DEXTROSE-NACL 20-5-0.45 MEQ/L-%-% IV SOLN
INTRAVENOUS | Status: AC
  Administered 2013-12-30 – 2013-12-31 (×2): via INTRAVENOUS
  Filled 2013-12-30 (×2): qty 1000

## 2013-12-30 MED ORDER — MUPIROCIN 2 % EX OINT: 1.0000 "application " | TOPICAL_OINTMENT | Freq: Once | CUTANEOUS | Status: DC

## 2013-12-30 MED ORDER — NEOSTIGMINE METHYLSULFATE 10 MG/10ML IV SOLN
INTRAVENOUS | Status: DC | PRN
  Administered 2013-12-30: 4 mg via INTRAVENOUS

## 2013-12-30 MED ORDER — SODIUM CHLORIDE 0.9 % IR SOLN
Status: AC
  Filled 2013-12-30: qty 1

## 2013-12-30 MED ORDER — DOCUSATE SODIUM 100 MG PO CAPS: 100.0000 mg | ORAL_CAPSULE | Freq: Two times a day (BID) | ORAL | Status: DC | PRN

## 2013-12-30 MED ORDER — GLYCOPYRROLATE 0.2 MG/ML IJ SOLN
INTRAMUSCULAR | Status: DC | PRN
  Administered 2013-12-30: .6 mg via INTRAVENOUS

## 2013-12-30 MED ORDER — SODIUM CHLORIDE 0.9 % IJ SOLN: 3.0000 mL | INTRAMUSCULAR | Status: DC | PRN

## 2013-12-30 MED ORDER — TRAMADOL HCL 50 MG PO TABS: 50.0000 mg | ORAL_TABLET | Freq: Four times a day (QID) | ORAL | Status: DC | PRN

## 2013-12-30 MED ORDER — HYDROCODONE-ACETAMINOPHEN 5-325 MG PO TABS
1.0000 | ORAL_TABLET | ORAL | Status: DC | PRN
  Administered 2013-12-30 – 2013-12-31 (×3): 2 via ORAL
  Filled 2013-12-30 (×3): qty 2

## 2013-12-30 MED ORDER — HYDROMORPHONE HCL 1 MG/ML IJ SOLN
INTRAMUSCULAR | Status: AC
  Administered 2013-12-31: 1 mg via INTRAVENOUS
  Filled 2013-12-30: qty 1

## 2013-12-30 MED ORDER — SUCCINYLCHOLINE CHLORIDE 20 MG/ML IJ SOLN
INTRAMUSCULAR | Status: DC | PRN
  Administered 2013-12-30: 180 mg via INTRAVENOUS

## 2013-12-30 MED ORDER — THROMBIN 5000 UNITS EX SOLR
CUTANEOUS | Status: AC
  Filled 2013-12-30: qty 10000

## 2013-12-30 MED ORDER — HYDROMORPHONE HCL 1 MG/ML IJ SOLN
0.5000 mg | INTRAMUSCULAR | Status: DC | PRN
  Administered 2013-12-30 – 2013-12-31 (×3): 1 mg via INTRAVENOUS
  Filled 2013-12-30 (×3): qty 1

## 2013-12-30 MED ORDER — ROCURONIUM BROMIDE 100 MG/10ML IV SOLN
INTRAVENOUS | Status: AC
  Filled 2013-12-30: qty 1

## 2013-12-30 MED ORDER — DOCUSATE SODIUM 100 MG PO CAPS
100.0000 mg | ORAL_CAPSULE | Freq: Two times a day (BID) | ORAL | Status: DC
  Administered 2013-12-30 – 2014-01-01 (×4): 100 mg via ORAL

## 2013-12-30 MED ORDER — HYDROMORPHONE HCL 1 MG/ML IJ SOLN
0.2500 mg | INTRAMUSCULAR | Status: DC | PRN
  Administered 2013-12-30 (×2): 0.5 mg via INTRAVENOUS

## 2013-12-30 MED ORDER — ACETAMINOPHEN 325 MG PO TABS: 650.0000 mg | ORAL_TABLET | ORAL | Status: DC | PRN

## 2013-12-30 MED ORDER — CLINDAMYCIN PHOSPHATE 900 MG/50ML IV SOLN
900.0000 mg | INTRAVENOUS | Status: AC
  Administered 2013-12-30: 900 mg via INTRAVENOUS

## 2013-12-30 SURGICAL SUPPLY — 50 items
BAG SPEC THK2 15X12 ZIP CLS (MISCELLANEOUS)
BAG ZIPLOCK 12X15 (MISCELLANEOUS) IMPLANT
BNDG COHESIVE 4X5 TAN STRL (GAUZE/BANDAGES/DRESSINGS) ×2 IMPLANT
CHLORAPREP W/TINT 26ML (MISCELLANEOUS) IMPLANT
CLEANER TIP ELECTROSURG 2X2 (MISCELLANEOUS) ×3 IMPLANT
CLOSURE WOUND 1/2 X4 (GAUZE/BANDAGES/DRESSINGS) ×1
CLOTH 2% CHLOROHEXIDINE 3PK (PERSONAL CARE ITEMS) ×3 IMPLANT
DRAPE INCISE IOBAN 85X60 (DRAPES) ×2 IMPLANT
DRAPE MICROSCOPE LEICA (MISCELLANEOUS) ×3 IMPLANT
DRAPE POUCH INSTRU U-SHP 10X18 (DRAPES) ×3 IMPLANT
DRAPE SURG 17X11 SM STRL (DRAPES) ×3 IMPLANT
DRAPE UTILITY XL STRL (DRAPES) ×3 IMPLANT
DRSG AQUACEL AG ADV 3.5X 4 (GAUZE/BANDAGES/DRESSINGS) IMPLANT
DRSG AQUACEL AG ADV 3.5X 6 (GAUZE/BANDAGES/DRESSINGS) ×2 IMPLANT
DURAPREP 26ML APPLICATOR (WOUND CARE) ×3 IMPLANT
DURASEAL SPINE SEALANT 3ML (MISCELLANEOUS) IMPLANT
ELECT BLADE TIP CTD 4 INCH (ELECTRODE) IMPLANT
ELECT REM PT RETURN 9FT ADLT (ELECTROSURGICAL) ×3
ELECTRODE REM PT RTRN 9FT ADLT (ELECTROSURGICAL) ×1 IMPLANT
GLOVE BIOGEL PI IND STRL 7.5 (GLOVE) ×1 IMPLANT
GLOVE BIOGEL PI INDICATOR 7.5 (GLOVE) ×2
GLOVE SURG SS PI 7.5 STRL IVOR (GLOVE) ×3 IMPLANT
GLOVE SURG SS PI 8.0 STRL IVOR (GLOVE) ×6 IMPLANT
GOWN STRL REUS W/TWL XL LVL3 (GOWN DISPOSABLE) ×6 IMPLANT
IV CATH 14GX2 1/4 (CATHETERS) IMPLANT
KIT BASIN OR (CUSTOM PROCEDURE TRAY) ×3 IMPLANT
KIT POSITIONING SURG ANDREWS (MISCELLANEOUS) ×3 IMPLANT
MANIFOLD NEPTUNE II (INSTRUMENTS) ×3 IMPLANT
NDL SPNL 18GX3.5 QUINCKE PK (NEEDLE) ×2 IMPLANT
NEEDLE SPNL 18GX3.5 QUINCKE PK (NEEDLE) ×6 IMPLANT
PACK LAMINECTOMY ORTHO (CUSTOM PROCEDURE TRAY) ×3 IMPLANT
PATTIES SURGICAL .5 X.5 (GAUZE/BANDAGES/DRESSINGS) IMPLANT
PATTIES SURGICAL .75X.75 (GAUZE/BANDAGES/DRESSINGS) IMPLANT
PATTIES SURGICAL 1X1 (DISPOSABLE) IMPLANT
SPONGE SURGIFOAM ABS GEL 100 (HEMOSTASIS) ×3 IMPLANT
STAPLER VISISTAT (STAPLE) ×2 IMPLANT
STRIP CLOSURE SKIN 1/2X4 (GAUZE/BANDAGES/DRESSINGS) ×2 IMPLANT
SUT NURALON 4 0 TR CR/8 (SUTURE) IMPLANT
SUT PROLENE 3 0 PS 2 (SUTURE) ×3 IMPLANT
SUT VIC AB 1 CT1 27 (SUTURE)
SUT VIC AB 1 CT1 27XBRD ANTBC (SUTURE) IMPLANT
SUT VIC AB 1-0 CT2 27 (SUTURE) IMPLANT
SUT VIC AB 2-0 CT1 27 (SUTURE)
SUT VIC AB 2-0 CT1 TAPERPNT 27 (SUTURE) IMPLANT
SUT VIC AB 2-0 CT2 27 (SUTURE) ×2 IMPLANT
SUT VLOC 180 0 24IN GS25 (SUTURE) ×2 IMPLANT
SYR 3ML LL SCALE MARK (SYRINGE) IMPLANT
TOWEL OR 17X26 10 PK STRL BLUE (TOWEL DISPOSABLE) ×3 IMPLANT
TOWEL OR NON WOVEN STRL DISP B (DISPOSABLE) ×3 IMPLANT
YANKAUER SUCT BULB TIP NO VENT (SUCTIONS) IMPLANT

## 2013-12-30 NOTE — Brief Op Note (Signed)
12/30/2013  10:59 AM  PATIENT:  Frank MollWilliam B Hensley  48 y.o. male  PRE-OPERATIVE DIAGNOSIS:  STENOSIS L2-3/L3-4  POST-OPERATIVE DIAGNOSIS:  STENOSIS L2-3/L3-4  PROCEDURE:  Procedure(s): LUMBAR DECOMPRESSION L2-3, REDO L3-4  (Left)  SURGEON:  Surgeon(s) and Role:    * Javier DockerJeffrey C Eithel Ryall, MD - Primary  PHYSICIAN ASSISTANT:   ASSISTANTS: Bissell   ANESTHESIA:   general  EBL:  Total I/O In: -  Out: 250 [Blood:250]  BLOOD ADMINISTERED:none  DRAINS: none   LOCAL MEDICATIONS USED:  MARCAINE     SPECIMEN:  No Specimen  DISPOSITION OF SPECIMEN:  N/A  COUNTS:  YES  TOURNIQUET:  * No tourniquets in log *  DICTATION: .Other Dictation: Dictation Number [id number]  PLAN OF CARE: Admit for overnight observation  PATIENT DISPOSITION:  PACU - hemodynamically stable.   Delay start of Pharmacological VTE agent (>24hrs) due to surgical blood loss or risk of bleeding: yes

## 2013-12-30 NOTE — Op Note (Signed)
NAME:  Frank Hensley, Frank Hensley              ACCOUNT NO.:  [id number]  MEDICAL RECORD NO.:  [id number]  LOCATION:  [facility]                         [facility]  PHYSICIAN:  Jene EveryJeffrey Krystalle Pilkington, M.D.    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  POSTOPERATIVE DIAGNOSIS:  Spinal stenosis severe L2-3, recurrent at L3- 4.  PROCEDURE PERFORMED:  Redo lumbar decompression L3-4, L2-3 with central laminectomies of L3, bilateral hemilaminotomies of L2 and foraminotomies of L3 and L4 bilaterally.  ANESTHESIA:  General.  ASSISTANT:  Lanna PocheJacqueline Bissell, PA, technical difficulty increased due to the patient's morbid obesity.  BMI was 47.  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  Risk and benefits discussed including bleeding, infection, damage to neurovascular structures, DVT, PE, anesthetic complications, etc.  TECHNIQUE:  Patient in supine position, after induction of adequate general anesthesia, 3 g Kefzol, 900 clinda, he was placed prone on a Wilson frame Brilliant[employer]s table, all bony prominence in the axilla, well padded.  Foley to gravity.  Lumbar region was prepped and draped in usual sterile fashion.  An 18-gauge needle was utilized to localize the 2-3, 3-4 interspace confirmed with x-ray.  Incision was made from spinous process above 2 to below 4.  Subcutaneous tissue was dissected. Electrocautery was utilized to achieve hemostasis, a very ample subcutaneous adipose tissue.  Dorsal lumbar fascia identified and divided in line with skin incision.  Paraspinous muscle elevated from lamina 2-3 and 3-4 bilaterally.  Deep McCullough  retractors were placed. We spent considerable time achieving hemostasis meticulously with 0.25% Marcaine with epinephrine and was infiltrated in paraspinous musculature.  I removed the spinous processes of L2 and L3.  So, we started first at the interspace at 2-3, there is mild scoliosis.  With the operating microscope, we performed hemilaminotomies of caudad edge of 2 bilaterally.  Severe stenosis was noted.  We decompressed the lateral recesses to the medial border of the pedicle, detached the ligamentum flavum from the caudad edge of 2.  Epidural fat was noted. Severe ligamentum flavum and facet hypertrophy was noted.  Then used our disk decompression caudad.  We continued with removal of the central lamina of 3 protecting with a neuro patty at all times.  We detached ligamentum,  we detached epidural fibrosis from the caudad edge of 3, utilized an FA 2-0 micro curette protecting the neural elements at all times.  Fair amount of epidural fibrosis noted that was meticulously mobilized.  We continued with our central laminectomy of 3.  We then decompressed the lateral recesses to the medial border of the pedicle. We performed foraminotomies of 2 and 3 bilaterally.  The epidural venous plexus was noted and cauterized.  There was no disk herniation at 2-3 or 3-4 noted.  With excellent restoration of the thecal sac and neural probe was passed freely in and out the foramen at 2, 3, and 4  bilaterally above the pedicle of 2 and below the pedicle of 4 confirmed with the x-ray by positioning.  Good restoration of the thecal sac was noted.  No evidence of CSF leakage.  Again,  meticulous hemostasis was obtained with thrombin-soaked Gelfoam, bone wax, electrocautery.  We removed the [facility] retractor.  No evidence of active bleeding was noted, copiously irrigated the paraspinous tissue with antibiotic irrigation.  Dorsolumbar fascia with 1 Vicryl interrupted figure-of- eight sutures and a  running V-Loc subcu with multiple layers of 2-0 and skin with staples.  Wound was dressed sterilely, placed supine on the hospital bed, extubated without difficulty, and transported to the recovery room in satisfactory condition.  There was significant time taken to position the patient due to the size and to transport the patient.  Excellent decompression noted.  Severe stenosis was also noted at 3-4 centrally consistent with that seen on the myelogram.     Jene Every, M.D.     Cordelia Pen  D:  12/30/2013  T:  12/30/2013  Job:  [id number]

## 2013-12-30 NOTE — Progress Notes (Signed)
PT  Note  Patient Details Name: Frank MollWilliam B Hensley MRN: [id number] DOB: June 01, 1965   Cancelled Treatment:    Reason Eval/Treat Not Completed: PT screened, no needs identified, will sign off  Pt seen walking with wife in hallway and nurse stated this is his second time up and walking. I asked if he is comfortable with his back precautions and body mechanics and he states he is fine aware. He and his wife state they will be fine and she will be there to assist as well.  No PT needs at this time.    Hanley SeamenBRITT, [name], [place] Pager: [phone] 12/30/2013  12/30/2013, 5:39 PM

## 2013-12-30 NOTE — Transfer of Care (Signed)
Immediate Anesthesia Transfer of Care Note  Patient: Frank MollWilliam B Hensley  Procedure(s) Performed: Procedure(s) (LRB): LUMBAR DECOMPRESSION L2-3, REDO L3-4  (Left)  Patient Location: PACU  Anesthesia Type: General  Level of Consciousness: sedated, patient cooperative and responds to stimulation  Airway & Oxygen Therapy: Patient Spontanous Breathing and Patient connected to face mask oxgen  Post-op Assessment: Report given to PACU RN and Post -op Vital signs reviewed and stable  Post vital signs: Reviewed and stable  Complications: No apparent anesthesia complications

## 2013-12-30 NOTE — Discharge Instructions (Signed)
Walk As Tolerated utilizing back precautions.  No bending, twisting, or lifting.  No driving for 2 weeks.   May shower with aquacel dressing in place. If the dressing becomes saturated or peels off, remove aquacel dressing and place gauze and tape dressing which should be kept clean and dry and changed daily. Do not remove steri-strips if they are present. See Dr. Shelle IronBeane in office in 10 to 14 days. Begin taking aspirin 81mg  per day starting 4 days after your surgery if not allergic to aspirin or on another blood thinner. Walk daily even outside. Use a cane or walker only if necessary. Avoid sitting on soft sofas.

## 2013-12-30 NOTE — H&P (View-Only) (Signed)
Frank MollWilliam B Hensley is an 48 y.o. male.   Chief Complaint: back and bilateral leg pain HPI: The patient is a 48 year old male who presents today for follow up of their back. The patient is being followed for their low back pain. They are now 7 years, 6 months out from injury. Symptoms reported today include: pain (low back) and leg pain (bilateral). Current treatment includes: home exercise program, activity modification, NSAIDs and Neurontin. The following medication has been used for pain control: Ultram, Norco and Neurontin and ibuprofen. Note for "Follow-up back": The patient was placed on light duty with a 10lb. lifting restriction, no prolonged sitting or standing, and no repetitive bending, but the patient is currently out of work due to light duty is not available. NCM Frank FlurryLinda Hensley. Surgery is scheduled for 12/30/2013  Frank Hensley follows up. He still reports when he is up and tries to come off his Neurontin. He is actually having severe pain and unable to get around. It does help him significantly. He is taking up to three pills three times a day and occasionally four at night. He takes two Tramadol a day, an occasional ibuprofen, and occasional Norco. He is here with Frank FlurryLinda Hensley, case manager.  No past medical history on file.  No past surgical history on file.  No family history on file. Social History:  has no tobacco, alcohol, and drug history on file.  Allergies: No Known Allergies   (Not in a hospital admission)  No results found for this or any previous visit (from the past 48 hour(s)). No results found.  Review of Systems  Constitutional: Negative.   HENT: Negative.   Eyes: Negative.   Respiratory: Negative.   Cardiovascular: Negative.   Gastrointestinal: Negative.   Genitourinary: Negative.   Musculoskeletal: Positive for back pain.  Skin: Negative.   Neurological: Positive for sensory change and focal weakness.  Psychiatric/Behavioral: Negative.     There were  no vitals taken for this visit. Physical Exam  Constitutional: He is oriented to person, place, and time. He appears well-developed and well-nourished.  HENT:  Head: Normocephalic and atraumatic.  Eyes: Conjunctivae and EOM are normal. Pupils are equal, round, and reactive to light.  Neck: Normal range of motion. Neck supple.  Cardiovascular: Normal rate and regular rhythm.   Respiratory: Effort normal and breath sounds normal.  GI: Soft. Bowel sounds are normal.  Musculoskeletal:  On exam, he is upright in no distress. He reports bilateral leg pain mostly down the anterior aspect of the thighs and some down the posterior but not below the knee and not into the dorsum of the foot. There is some slight quad weakness. He has limited extension and relieved with forward flexion. Lumbar spine exam reveals no evidence of soft tissue swelling, ecchymosis or deformity. On palpation there is no flank pain with percussion. Nontender over the trochanters.  Neurological: He is alert and oriented to person, place, and time. He has normal reflexes.  Skin: Skin is warm and dry.    I reviewed his MRI. It demonstrates stenosis at 2-3, 3-4. Lateral recess stenosis at 4-5.  Assessment/Plan 1. Severe spinal stenosis 2-3 and 3-4. 2. Asymptomatic lateral recess stenosis at 4-5. 3. History of lumbar decompression at 4-5. 4. Morbid obesity, improving now at 353lb.  We will proceed with decompression in a month. He will meet his weight target. I had an extensive discussion of the risks and benefits of the lumbar decompression with the patient including bleeding, infection, damage  to neurovascular structures, epidural fibrosis, CSF leak requiring repair. We also discussed increase in pain, adjacent segment disease, recurrent disc herniation, need for future surgery including repeat decompression and/or fusion. We also discussed risks of postoperative hematoma, paralysis, anesthetic complications including  DVT, PE, death, or cardiopulmonary dysfunction. In addition, the perioperative and postoperative courses were discussed in detail including the rehabilitative time and return to functional activity and work. I provided the patient with an illustrated handout and utilized the appropriate surgical models. Continue Tramadol, ibuprofen, Neurontin, and Norco. We discussed these issues separately and in front of the patient with Frank Hensley, case manager. Postoperatively 6-12 weeks until maximum medical improvement. Overnight in the hospital.  Plan microlumbar decompression L2-3, L3-4, possible L4-5 left  BISSELL, JACLYN M. for Dr. Shelle Iron 12/23/2013, 7:23 AM

## 2013-12-30 NOTE — Interval H&P Note (Signed)
History and Physical Interval Note:  12/30/2013 8:19 AM  Frank MollWilliam B Hensley  has presented today for surgery, with the diagnosis of STENOSIS L2-3/L3-4  The various methods of treatment have been discussed with the patient and family. After consideration of risks, benefits and other options for treatment, the patient has consented to  Procedure(s): LUMBAR DECOMPRESSION L2-3/L3-4/POSSIBLE L4-5 ON LEFT (Left) as a surgical intervention .  The patient's history has been reviewed, patient examined, no change in status, stable for surgery.  I have reviewed the patient's chart and labs.  Questions were answered to the patient's satisfaction.     Alazay Leicht C

## 2013-12-30 NOTE — Anesthesia Postprocedure Evaluation (Signed)
  Anesthesia Post-op Note  Patient: Frank MollWilliam B Hensley  Procedure(s) Performed: Procedure(s) (LRB): LUMBAR DECOMPRESSION L2-3, REDO L3-4  (Left)  Patient Location: PACU  Anesthesia Type: General  Level of Consciousness: awake and alert   Airway and Oxygen Therapy: Patient Spontanous Breathing  Post-op Pain: mild  Post-op Assessment: Post-op Vital signs reviewed, Patient's Cardiovascular Status Stable, Respiratory Function Stable, Patent Airway and No signs of Nausea or vomiting  Last Vitals:  Filed Vitals:   12/30/13 1300  BP: 152/83  Pulse: 95  Temp:   Resp: 19    Post-op Vital Signs: stable   Complications: No apparent anesthesia complications

## 2013-12-31 ENCOUNTER — Encounter (HOSPITAL_COMMUNITY): Payer: Self-pay | Admitting: Specialist

## 2013-12-31 LAB — CBC
HCT: 36.9 % — ABNORMAL LOW (ref 39.0–52.0)
HEMOGLOBIN: 12.5 g/dL — AB (ref 13.0–17.0)
MCH: 27.9 pg (ref 26.0–34.0)
MCHC: 33.9 g/dL (ref 30.0–36.0)
MCV: 82.4 fL (ref 78.0–100.0)
Platelets: 146 10*3/uL — ABNORMAL LOW (ref 150–400)
RBC: 4.48 MIL/uL (ref 4.22–5.81)
RDW: 13.3 % (ref 11.5–15.5)
WBC: 8.9 10*3/uL (ref 4.0–10.5)

## 2013-12-31 LAB — BASIC METABOLIC PANEL
Anion gap: 11 (ref 5–15)
BUN: 9 mg/dL (ref 6–23)
CHLORIDE: 100 meq/L (ref 96–112)
CO2: 24 meq/L (ref 19–32)
Calcium: 8.5 mg/dL (ref 8.4–10.5)
Creatinine, Ser: 1.15 mg/dL (ref 0.50–1.35)
GFR calc Af Amer: 86 mL/min — ABNORMAL LOW (ref >90)
GFR calc non Af Amer: 74 mL/min — ABNORMAL LOW (ref >90)
GLUCOSE: 165 mg/dL — AB (ref 70–99)
POTASSIUM: 3.9 meq/L (ref 3.7–5.3)
SODIUM: 135 meq/L — AB (ref 137–147)

## 2013-12-31 LAB — GLUCOSE, CAPILLARY
GLUCOSE-CAPILLARY: 161 mg/dL — AB (ref 70–99)
GLUCOSE-CAPILLARY: 142 mg/dL — AB (ref 70–99)
Glucose-Capillary: 152 mg/dL — ABNORMAL HIGH (ref 70–99)
Glucose-Capillary: 173 mg/dL — ABNORMAL HIGH (ref 70–99)

## 2013-12-31 NOTE — Progress Notes (Signed)
CSW consulted for SNF placement. PN reviewed.Pt plans to return home following hospital d/c. No skilled needs noted by PT / OT.  Cori RazorJamie Shamon Lobo LCSW [phone]

## 2013-12-31 NOTE — Progress Notes (Signed)
OT Cancellation Note  Patient Details Name: Frank MollWilliam B Hensley MRN: [id number] DOB: 11-24-1965   Cancelled Treatment:    Reason Eval/Treat Not Completed: OT screened, no needs identified, will sign off.  Pt has had previous back surgery, has help DME, and no ADL/bathroom concerns following precautions. Will sign off  Carlissa Pesola 12/31/2013, 7:43 AM Marica OtterMaryellen Rozina Pointer, OTR/L [phone] 12/31/2013

## 2013-12-31 NOTE — Care Management Note (Addendum)
    Page 1 of 1   12/31/2013     11:46:03 AM CARE MANAGEMENT NOTE 12/31/2013  Patient:  Frank Hensley, Frank Hensley   Account Number:  [id number]  Date Initiated:  12/31/2013  Documentation initiated by:  [facility]  Subjective/Objective Assessment:   adm: LUMBAR DECOMPRESSION L2-3, REDO L3-4  (Left)     Action/Plan:   discharge planning   Anticipated DC Date:  01/01/2014   Anticipated DC Plan:  [place]  CM consult  Patient refused services      Choice offered to / List presented to:             Status of service:  Completed, signed off Medicare Important Message given?   (If response is "NO", the following Medicare IM given date fields will be blank) Date Medicare IM given:   Medicare IM given by:   Date Additional Medicare IM given:   Additional Medicare IM given by:    Discharge Disposition:  HOME/SELF CARE  Per UR Regulation:  Reviewed for med. necessity/level of care/duration of stay  If discussed at [place] of Stay Meetings, dates discussed:    Comments:  12/31/13 08:15 Cm met with pt in room to discuss post hospitalization needs.  Pt is worker's Comp but states he does not need any DME or HH serivces.  CM ensured pt if he changed his mind, CM would be happy to arrange for [place] or DME.  Mariane Masters, BSN, Cm [phone].

## 2013-12-31 NOTE — Progress Notes (Signed)
Subjective: 1 Day Post-Op Procedure(s) (LRB): LUMBAR DECOMPRESSION L2-3, REDO L3-4  (Left) Patient reports pain as severe- incisional back pain. Leg pain improved. He is still noting some numbness and tingling in his legs. Has been OOB several times - last night to smoke, as well as with PT today. Voiding without difficulty. He feels the muscle relaxer is actually most helpful for his pain, moreso than the norco or oxycodone. He is hoping to go home today. Seen by myself and Dr. Shelle IronBeane  Objective: Vital signs in last 24 hours: Temp:  [97.6 F (36.4 C)-100 F (37.8 C)] 98.6 F (37 C) (10/08 0620) Pulse Rate:  [79-105] 102 (10/08 0620) Resp:  [8-19] 16 (10/08 0620) BP: (113-157)/(52-96) 120/52 mmHg (10/08 0912) SpO2:  [95 %-100 %] 95 % (10/08 0620)  Intake/Output from previous day: 10/07 0701 - 10/08 0700 In: 3297.5 [P.O.:600; I.V.:2642.5; IV Piggyback:55] Out: 1875 [Urine:1625; Blood:250] Intake/Output this shift:     Recent Labs  12/31/13 0500  HGB 12.5*    Recent Labs  12/31/13 0500  WBC 8.9  RBC 4.48  HCT 36.9*  PLT 146*    Recent Labs  12/31/13 0500  NA 135*  K 3.9  CL 100  CO2 24  BUN 9  CREATININE 1.15  GLUCOSE 165*  CALCIUM 8.5   No results found for this basename: LABPT, INR,  in the last 72 hours  Neurologically intact ABD soft Neurovascular intact Sensation intact distally Intact pulses distally Dorsiflexion/Plantar flexion intact Incision: dressing C/D/I and no drainage No cellulitis present Compartment soft no calf pain or sign of DVT  Assessment/Plan: 1 Day Post-Op Procedure(s) (LRB): LUMBAR DECOMPRESSION L2-3, REDO L3-4  (Left) Advance diet Up with therapy D/C IV fluids Discussed D/C instructions, dressing instructions, precautions Encouraged incentive spirometer Possible D/C later today if pain well controlled on PO meds and does well with PM PT  Sandhya Denherder M. 12/31/2013, 10:16 AM

## 2014-01-01 LAB — GLUCOSE, CAPILLARY: Glucose-Capillary: 153 mg/dL — ABNORMAL HIGH (ref 70–99)

## 2014-01-01 MED ORDER — IBUPROFEN 800 MG PO TABS: 800.0000 mg | ORAL_TABLET | Freq: Three times a day (TID) | ORAL | Status: DC | PRN

## 2014-01-01 NOTE — Discharge Summary (Signed)
Pt alert and oriented. Pt tolerating pain well with prescribed pain medication and muscle relaxer. Pt and significant other educated on discharge instruction and given prescribed prescription to pick up from pharmacy. VSS. Pt Concerns were addressed. Pt discharged in wheelchair.

## 2014-01-01 NOTE — Plan of Care (Signed)
Problem: Consults Goal: Diagnosis - Spinal Surgery Outcome: Completed/Met Date Met:  01/01/14 Lumbar Laminectomy (Complex) with foramenotomies and decompression

## 2014-01-01 NOTE — Discharge Summary (Signed)
Physician Discharge Summary   Patient ID: Frank Hensley MRN: [id number] DOB/AGE: 47-Aug-1967 47 y.o.  Admit date: 12/30/2013 Discharge date: 01/01/2014  Primary Diagnosis:   STENOSIS L2-3/L3-4  Admission Diagnoses:  Past Medical History  Diagnosis Date  . Diabetes mellitus without complication   . GERD (gastroesophageal reflux disease)    Discharge Diagnoses:   Principal Problem:   Spinal stenosis of lumbar region Active Problems:   Spinal stenosis, multilevel  Procedure:  Procedure(s) (LRB): LUMBAR DECOMPRESSION L2-3, REDO L3-4  (Left)   Consults: None  HPI:  see H&P    Laboratory Data: Hospital Outpatient Visit on 12/23/2013  Component Date Value Ref Range Status  . MRSA, PCR 12/23/2013 NEGATIVE  NEGATIVE Final  . Staphylococcus aureus 12/23/2013 POSITIVE* NEGATIVE Final   Comment:                                 The Xpert SA Assay (FDA                          approved for NASAL specimens                          in patients over 40 years of age),                          is one component of                          a comprehensive surveillance                          program.  Test performance has                          been validated by [employer] for patients greater                          than or equal to 48 year old.                          It is not intended                          to diagnose infection nor to                          guide or monitor treatment.  . Sodium 12/23/2013 140  137 - 147 mEq/L Final  . Potassium 12/23/2013 4.1  3.7 - 5.3 mEq/L Final  . Chloride 12/23/2013 103  96 - 112 mEq/L Final  . CO2 12/23/2013 23  19 - 32 mEq/L Final  . Glucose, Bld 12/23/2013 140* 70 - 99 mg/dL Final  . BUN 12/23/2013 9  6 - 23 mg/dL Final  . Creatinine, Ser 12/23/2013 0.95  0.50 - 1.35 mg/dL Final  . Calcium 12/23/2013 9.5  8.4 - 10.5 mg/dL Final  . GFR calc non Af Amer 12/23/2013 >90  >90 mL/min Final  .  GFR calc  Af Amer 12/23/2013 >90  >90 mL/min Final   Comment: (NOTE)                          The eGFR has been calculated using the CKD EPI equation.                          This calculation has not been validated in all clinical situations.                          eGFR's persistently <90 mL/min signify possible Chronic Kidney                          Disease.  . Anion gap 12/23/2013 14  5 - 15 Final  . WBC 12/23/2013 5.9  4.0 - 10.5 K/uL Final  . RBC 12/23/2013 5.13  4.22 - 5.81 MIL/uL Final  . Hemoglobin 12/23/2013 14.3  13.0 - 17.0 g/dL Final  . HCT 12/23/2013 41.4  39.0 - 52.0 % Final  . MCV 12/23/2013 80.7  78.0 - 100.0 fL Final  . MCH 12/23/2013 27.9  26.0 - 34.0 pg Final  . MCHC 12/23/2013 34.5  30.0 - 36.0 g/dL Final  . RDW 12/23/2013 13.1  11.5 - 15.5 % Final  . Platelets 12/23/2013 166  150 - 400 K/uL Final    Recent Labs  12/31/13 0500  HGB 12.5*    Recent Labs  12/31/13 0500  WBC 8.9  RBC 4.48  HCT 36.9*  PLT 146*    Recent Labs  12/31/13 0500  NA 135*  K 3.9  CL 100  CO2 24  BUN 9  CREATININE 1.15  GLUCOSE 165*  CALCIUM 8.5   No results found for this basename: LABPT, INR,  in the last 72 hours  X-Rays:Dg Chest 2 View  12/23/2013   CLINICAL DATA:  Preoperative evaluation for lumbar spine surgery, history diabetes, hypertension, GERD  EXAM: CHEST  2 VIEW  COMPARISON:  07/07/2007  FINDINGS: Normal heart size, mediastinal contours, and pulmonary vascularity.  Lungs clear.  No pneumothorax.  Bones unremarkable.  IMPRESSION: Normal exam.   Electronically Signed   By: Lavonia Dana M.D.   On: 12/23/2013 09:45   Dg Lumbar Spine 2-3 Views  12/23/2013   CLINICAL DATA:  48 year old male -preoperative examination for lumbar decompression.  EXAM: LUMBAR SPINE - 2-3 VIEW  COMPARISON:  07/07/2007 lumbar spine radiographs  FINDINGS: Five non rib-bearing lumbar type vertebra are again identified.  Mild apex right lower lumbar scoliosis noted.  1 cm posteriolateral subluxation  at L2-3 is identified.  Laminectomy changes at L4 is present.  Mild multilevel degenerative disc disease present.  No acute bony abnormalities identified.  IMPRESSION: Five non rib-bearing lumbar type vertebra are with mild apex right lower lumbar scoliosis.  1 cm posteriolateral subluxation at L2-3.  Mild multilevel degenerative disc disease.   Electronically Signed   By: Hassan Rowan M.D.   On: 12/23/2013 09:47   Dg Spine Portable 1 View  12/30/2013   CLINICAL DATA:  Intraoperative evaluation during lumbar decompression at L2-3, L3-4 and possibly L4-5.  EXAM: PORTABLE SPINE - 1 VIEW  COMPARISON:  Earlier the same day  FINDINGS: Single cross-table portable view obtained laterally the lumbar spine at 0920 hrs is labeled #3 and shows soft tissue retractors in the lower back. Same numbering  scheme is used for this exam as was used on the portable study immediately prior. Two surgical probes are identified overlying the surgical bed. The tip of the more caudal of the 2 probes overlies the L4 pedicle. The more cranial of the 2 probe tips overlies a position just caudal to the L2 pedicle.  IMPRESSION: Intraoperative localization.   Electronically Signed   By: Misty Stanley M.D.   On: 12/30/2013 10:49   Dg Spine Portable 1 View  12/30/2013   CLINICAL DATA:  Intraoperative probe localization.  EXAM: PORTABLE SPINE - 1 VIEW  COMPARISON:  Earlier today  FINDINGS: Image labeled 2 demonstrates tissue spreaders posterior to the L2 and L3 vertebra. There is a surgical probe which is directed towards the L2 spinous process. A more inferior surgical probe is directed towards the L3 spinous process.  IMPRESSION: 1. Intraoperative portable x-ray for surgical probe localization.   Electronically Signed   By: Kerby Moors M.D.   On: 12/30/2013 09:29   Dg Spine Portable 1 View  12/30/2013   CLINICAL DATA:  Lumbar decompression plan for L2-3 and L3-4, and possibly L4-5.  EXAM: PORTABLE SPINE - 1 VIEW  COMPARISON:  12/23/2013   FINDINGS: Prior laminectomies/posterior decompression at L4. The upper needle was oriented towards the L2 spinous process. The lower needle is oriented below the L3 spinous process and towards the back of the L4 vertebral body.  IMPRESSION: 1. Upper needle oriented at the L2 spinous process. Lower needle oriented below the L3 spinous process. Prior posterior decompression at L4.   Electronically Signed   By: Sherryl Barters M.D.   On: 12/30/2013 09:12    EKG: Orders placed during the hospital encounter of 12/30/13  . EKG 12-LEAD  . EKG 12-LEAD  . EKG 12-LEAD     Hospital Course: Patient was admitted to [facility] and taken to the OR and underwent the above state procedure without complications.  Patient tolerated the procedure well and was later transferred to the recovery room and then to the orthopaedic floor for postoperative care.  They were given PO and IV analgesics for pain control following their surgery.  They were given 24 hours of postoperative antibiotics.   PT was consulted postop to assist with mobility and transfers.  The patient was allowed to be WBAT with therapy and was taught back precautions. Discharge planning was consulted to help with postop disposition and equipment needs.  Patient had a good night on the evening of surgery and started to get up OOB with therapy on day one. Patient was seen in rounds daily and was ready to go home on day two.  They were given discharge instructions and dressing directions.  They were instructed on when to follow up in the office with Dr. Tonita Cong.   Diet: Diabetic diet Activity:WBAT; Lspine precautions Follow-up:in 10-14 days Disposition - Home Discharged Condition: good   Discharge Instructions   Call MD / Call 911    Complete by:  As directed   If you experience chest pain or shortness of breath, CALL 911 and be transported to the hospital emergency room.  If you develope a fever above 101 F, pus (white drainage) or increased  drainage or redness at the wound, or calf pain, call your surgeon's office.     Constipation Prevention    Complete by:  As directed   Drink plenty of fluids.  Prune juice may be helpful.  You may use a stool softener, such as Colace (over  the counter) 100 mg twice a day.  Use MiraLax (over the counter) for constipation as needed.     Diet - low sodium heart healthy    Complete by:  As directed      Increase activity slowly as tolerated    Complete by:  As directed             Medication List    STOP taking these medications       HYDROcodone-acetaminophen 7.5-325 MG per tablet  Commonly known as:  [place] these medications       docusate sodium 100 MG capsule  Commonly known as:  COLACE  Take 1 capsule (100 mg total) by mouth 2 (two) times daily as needed for mild constipation.     gabapentin 300 MG capsule  Commonly known as:  NEURONTIN  Take 900-1,200 mg by mouth 3 (three) times daily.     ibuprofen 800 MG tablet  Commonly known as:  ADVIL,MOTRIN  Take 1 tablet (800 mg total) by mouth 3 (three) times daily as needed (back pain.).     lisinopril 20 MG tablet  Commonly known as:  PRINIVIL,ZESTRIL  Take 20 mg by mouth every morning.     metFORMIN 500 MG tablet  Commonly known as:  GLUCOPHAGE  Take 500 mg by mouth 2 (two) times daily with a meal.     methocarbamol 500 MG tablet  Commonly known as:  ROBAXIN  Take 1 tablet (500 mg total) by mouth 3 (three) times daily between meals as needed for muscle spasms.     oxyCODONE-acetaminophen 7.5-325 MG per tablet  Commonly known as:  PERCOCET  Take 1 tablet by mouth every 4 (four) hours as needed for pain.     traMADol 50 MG tablet  Commonly known as:  ULTRAM  Take 50 mg by mouth every 6 (six) hours as needed (back pain.).           Follow-up Information   Follow up with BEANE,JEFFREY C, MD In 2 weeks. (For suture removal)    Specialty:  Orthopedic Surgery   Contact information:   [address] [place] [address] [phone]       Signed: Lacie Draft, PA-C Orthopaedic Surgery 01/01/2014, 7:22 AM

## 2014-01-01 NOTE — Progress Notes (Signed)
Subjective: 2 Days Post-Op Procedure(s) (LRB): LUMBAR DECOMPRESSION L2-3, REDO L3-4  (Left) Patient reports pain as moderate.  Doing much better this AM. Back pain better controlled. Muscle relaxer still seems to be most helpful. Legs still feeling somewhat numb, left more than right, but leg pain improved. Voiding without difficulty. No BM yet. Feeling ready for D/C home.  Objective: Vital signs in last 24 hours: Temp:  [98.7 F (37.1 C)-100 F (37.8 C)] 98.7 F (37.1 C) (10/09 0454) Pulse Rate:  [89-112] 89 (10/09 0454) Resp:  [14-16] 16 (10/09 0454) BP: (112-145)/(52-76) 145/76 mmHg (10/09 0454) SpO2:  [94 %-97 %] 97 % (10/09 0454) Weight:  [162.206 kg (357 lb 9.6 oz)] 162.206 kg (357 lb 9.6 oz) (10/09 0123)  Intake/Output from previous day: 10/08 0701 - 10/09 0700 In: 900 [P.O.:840; IV Piggyback:60] Out: 1000 [Urine:1000] Intake/Output this shift:     Recent Labs  12/31/13 0500  HGB 12.5*    Recent Labs  12/31/13 0500  WBC 8.9  RBC 4.48  HCT 36.9*  PLT 146*    Recent Labs  12/31/13 0500  NA 135*  K 3.9  CL 100  CO2 24  BUN 9  CREATININE 1.15  GLUCOSE 165*  CALCIUM 8.5   No results found for this basename: LABPT, INR,  in the last 72 hours  Neurologically intact ABD soft Neurovascular intact Sensation intact distally Intact pulses distally Dorsiflexion/Plantar flexion intact Incision: dressing C/D/I and no drainage No cellulitis present Compartment soft no calf pain or sign of DVT  Assessment/Plan: 2 Days Post-Op Procedure(s) (LRB): LUMBAR DECOMPRESSION L2-3, REDO L3-4  (Left) Advance diet Up with therapy D/C IV fluids Reviewed D/C instructions D/C today to home Follow up 10-14 days post-op for staple removal Will discuss with Dr. Elissa LovettBeane  BISSELL, Dayna BarkerJACLYN M. 01/01/2014, 7:20 AM

## 2014-10-12 NOTE — Progress Notes (Signed)
Please put orders in Epic surgery 10-25-14 pre op 10-15-14 Thanks

## 2014-10-15 ENCOUNTER — Encounter (HOSPITAL_COMMUNITY)
Admission: RE | Admit: 2014-10-15 | Discharge: 2014-10-15 | Disposition: A | Discharge status: Home / Self Care | Payer: Worker's Compensation | Source: Ambulatory Visit | Attending: Orthopedic Surgery | Admitting: Orthopedic Surgery

## 2014-10-15 ENCOUNTER — Encounter (HOSPITAL_COMMUNITY): Payer: Self-pay

## 2014-10-15 DIAGNOSIS — Z01812 Encounter for preprocedural laboratory examination: Secondary | ICD-10-CM | POA: Insufficient documentation

## 2014-10-15 DIAGNOSIS — Y838 Other surgical procedures as the cause of abnormal reaction of the patient, or of later complication, without mention of misadventure at the time of the procedure: Secondary | ICD-10-CM | POA: Insufficient documentation

## 2014-10-15 DIAGNOSIS — M9689 Other intraoperative and postprocedural complications and disorders of the musculoskeletal system: Secondary | ICD-10-CM | POA: Diagnosis not present

## 2014-10-15 HISTORY — DX: Angina pectoris, unspecified: I20.9

## 2014-10-15 HISTORY — DX: Sleep apnea, unspecified: G47.30

## 2014-10-15 HISTORY — DX: Essential (primary) hypertension: I10

## 2014-10-15 HISTORY — DX: Unspecified osteoarthritis, unspecified site: M19.90

## 2014-10-15 LAB — CBC
HCT: 40.8 % (ref 39.0–52.0)
Hemoglobin: 13.8 g/dL (ref 13.0–17.0)
MCH: 27.7 pg (ref 26.0–34.0)
MCHC: 33.8 g/dL (ref 30.0–36.0)
MCV: 81.8 fL (ref 78.0–100.0)
PLATELETS: 157 10*3/uL (ref 150–400)
RBC: 4.99 MIL/uL (ref 4.22–5.81)
RDW: 12.9 % (ref 11.5–15.5)
WBC: 5.3 10*3/uL (ref 4.0–10.5)

## 2014-10-15 LAB — BASIC METABOLIC PANEL
Anion gap: 7 (ref 5–15)
BUN: 12 mg/dL (ref 6–20)
CO2: 25 mmol/L (ref 22–32)
Calcium: 9 mg/dL (ref 8.9–10.3)
Chloride: 105 mmol/L (ref 101–111)
Creatinine, Ser: 1 mg/dL (ref 0.61–1.24)
Glucose, Bld: 266 mg/dL — ABNORMAL HIGH (ref 65–99)
POTASSIUM: 4.2 mmol/L (ref 3.5–5.1)
SODIUM: 137 mmol/L (ref 135–145)

## 2014-10-15 LAB — SURGICAL PCR SCREEN
MRSA, PCR: NEGATIVE
Staphylococcus aureus: POSITIVE — AB

## 2014-10-15 LAB — URINALYSIS, ROUTINE W REFLEX MICROSCOPIC
Bilirubin Urine: NEGATIVE
Hgb urine dipstick: NEGATIVE
KETONES UR: NEGATIVE mg/dL
LEUKOCYTES UA: NEGATIVE
Nitrite: NEGATIVE
PH: 5.5 (ref 5.0–8.0)
Protein, ur: NEGATIVE mg/dL
Specific Gravity, Urine: 1.025 (ref 1.005–1.030)
Urobilinogen, UA: 0.2 mg/dL (ref 0.0–1.0)

## 2014-10-15 LAB — PROTIME-INR
INR: 1.04 (ref 0.00–1.49)
Prothrombin Time: 13.8 seconds (ref 11.6–15.2)

## 2014-10-15 LAB — URINE MICROSCOPIC-ADD ON

## 2014-10-15 LAB — APTT: aPTT: 29 seconds (ref 24–37)

## 2014-10-15 NOTE — Progress Notes (Signed)
12-30-13 - EKG - EPIC 12-23-13 - CXR - EPIC

## 2014-10-15 NOTE — Progress Notes (Signed)
10-15-14 - PCR Screen lab results from preop visit on 10-15-14, faxed to Dr. Charlann Boxer via [facility]

## 2014-10-15 NOTE — Patient Instructions (Addendum)
Frank Hensley  10/15/2014              Surgery is scheduled on October 25, 2014  Report to [facility] Main  Entrance take [facility]  elevators to 3rd floor to  Short Stay Center at 7:00  AM.  Call this number if you have problems the morning of surgery [phone]   Remember: ONLY 1 PERSON MAY GO WITH YOU TO SHORT STAY TO GET  READY MORNING OF YOUR SURGERY.  Do not eat food or drink liquids :After Midnight.     Take these medicines the morning of surgery with A SIP OF WATER: Gabapentin, Oxycodone, Lipitor                               You may not have any metal on your body including hair pins and              piercings  Do not wear jewelry, make-up, lotions, powders or perfumes, deodorant      .              Men may shave face and neck.   Do not bring valuables to the hospital. [place] IS NOT             RESPONSIBLE   FOR VALUABLES.  Contacts, dentures or bridgework may not be worn into surgery.  Leave suitcase in the car. After surgery it may be brought to your room.     Patients discharged the day of surgery will not be allowed to drive home.  Name and phone number of your driver:  Special Instructions: coughing and deep exercises, leg exercises              Please read over the following fact sheets you were given: _____________________________________________________________________             [facility] - Preparing for Surgery Before surgery, you can play an important role.  Because skin is not sterile, your skin needs to be as free of germs as possible.  You can reduce the number of germs on your skin by washing with CHG (chlorahexidine gluconate) soap before surgery.  CHG is an antiseptic cleaner which kills germs and bonds with the skin to continue killing germs even after washing. Please DO NOT use if you have an allergy to CHG or antibacterial soaps.  If your skin becomes reddened/irritated stop using the CHG and inform your nurse when you  arrive at Short Stay. Do not shave (including legs and underarms) for at least 48 hours prior to the first CHG shower.  You may shave your face/neck. Please follow these instructions carefully:  1.  Shower with CHG Soap the night before surgery and the  morning of Surgery.  2.  If you choose to wash your hair, wash your hair first as usual with your  normal  shampoo.  3.  After you shampoo, rinse your hair and body thoroughly to remove the  shampoo.                           4.  Use CHG as you would any other liquid soap.  You can apply chg directly  to the skin and wash  Gently with a scrungie or clean washcloth.  5.  Apply the CHG Soap to your body ONLY FROM THE NECK DOWN.   Do not use on face/ open                           Wound or open sores. Avoid contact with eyes, ears mouth and genitals (private parts).                       Wash face,  Genitals (private parts) with your normal soap.             6.  Wash thoroughly, paying special attention to the area where your surgery  will be performed.  7.  Thoroughly rinse your body with warm water from the neck down.  8.  DO NOT shower/wash with your normal soap after using and rinsing off  the CHG Soap.                9.  Pat yourself dry with a clean towel.            10.  Wear clean pajamas.            11.  Place clean sheets on your bed the night of your first shower and do not  sleep with pets. Day of Surgery : Do not apply any lotions/deodorants the morning of surgery.  Please wear clean clothes to the hospital/surgery center.  FAILURE TO FOLLOW THESE INSTRUCTIONS MAY RESULT IN THE CANCELLATION OF YOUR SURGERY PATIENT SIGNATURE_________________________________  NURSE SIGNATURE__________________________________  ________________________________________________________________________  WHAT IS A BLOOD TRANSFUSION? Blood Transfusion Information  A transfusion is the replacement of blood or some of its parts. Blood  is made up of multiple cells which provide different functions.  Red blood cells carry oxygen and are used for blood loss replacement.  White blood cells fight against infection.  Platelets control bleeding.  Plasma helps clot blood.  Other blood products are available for specialized needs, such as hemophilia or other clotting disorders. BEFORE THE TRANSFUSION  Who gives blood for transfusions?   Healthy volunteers who are fully evaluated to make sure their blood is safe. This is blood bank blood. Transfusion therapy is the safest it has ever been in the practice of medicine. Before blood is taken from a donor, a complete history is taken to make sure that person has no history of diseases nor engages in risky social behavior (examples are intravenous drug use or sexual activity with multiple partners). The donor's travel history is screened to minimize risk of transmitting infections, such as malaria. The donated blood is tested for signs of infectious diseases, such as HIV and hepatitis. The blood is then tested to be sure it is compatible with you in order to minimize the chance of a transfusion reaction. If you or a relative donates blood, this is often done in anticipation of surgery and is not appropriate for emergency situations. It takes many days to process the donated blood. RISKS AND COMPLICATIONS Although transfusion therapy is very safe and saves many lives, the main dangers of transfusion include:  1. Getting an infectious disease. 2. Developing a transfusion reaction. This is an allergic reaction to something in the blood you were given. Every precaution is taken to prevent this. The decision to have a blood transfusion has been considered carefully by your caregiver before blood is given. Blood is not given unless the benefits outweigh  the risks. AFTER THE TRANSFUSION  Right after receiving a blood transfusion, you will usually feel much better and more energetic. This is  especially true if your red blood cells have gotten low (anemic). The transfusion raises the level of the red blood cells which carry oxygen, and this usually causes an energy increase.  The nurse administering the transfusion will monitor you carefully for complications. HOME CARE INSTRUCTIONS  No special instructions are needed after a transfusion. You may find your energy is better. Speak with your caregiver about any limitations on activity for underlying diseases you may have. SEEK MEDICAL CARE IF:   Your condition is not improving after your transfusion.  You develop redness or irritation at the intravenous (IV) site. SEEK IMMEDIATE MEDICAL CARE IF:  Any of the following symptoms occur over the next 12 hours:  Shaking chills.  You have a temperature by mouth above 102 F (38.9 C), not controlled by medicine.  Chest, back, or muscle pain.  People around you feel you are not acting correctly or are confused.  Shortness of breath or difficulty breathing.  Dizziness and fainting.  You get a rash or develop hives.  You have a decrease in urine output.  Your urine turns a dark color or changes to pink, red, or brown. Any of the following symptoms occur over the next 10 days:  You have a temperature by mouth above 102 F (38.9 C), not controlled by medicine.  Shortness of breath.  Weakness after normal activity.  The white part of the eye turns yellow (jaundice).  You have a decrease in the amount of urine or are urinating less often.  Your urine turns a dark color or changes to pink, red, or brown. Document Released: 03/09/2000 Document Revised: 06/04/2011 Document Reviewed: 10/27/2007 ExitCare Patient Information 2014 [place].  _______________________________________________________________________  Incentive Spirometer  An incentive spirometer is a tool that can help keep your lungs clear and active. This tool measures how well you are filling your lungs  with each breath. Taking long deep breaths may help reverse or decrease the chance of developing breathing (pulmonary) problems (especially infection) following:  A long period of time when you are unable to move or be active. BEFORE THE PROCEDURE   If the spirometer includes an indicator to show your best effort, your nurse or respiratory therapist will set it to a desired goal.  If possible, sit up straight or lean slightly forward. Try not to slouch.  Hold the incentive spirometer in an upright position. INSTRUCTIONS FOR USE  3. Sit on the edge of your bed if possible, or sit up as far as you can in bed or on a chair. 4. Hold the incentive spirometer in an upright position. 5. Breathe out normally. 6. Place the mouthpiece in your mouth and seal your lips tightly around it. 7. Breathe in slowly and as deeply as possible, raising the piston or the ball toward the top of the column. 8. Hold your breath for 3-5 seconds or for as long as possible. Allow the piston or ball to fall to the bottom of the column. 9. Remove the mouthpiece from your mouth and breathe out normally. 10. Rest for a few seconds and repeat Steps 1 through 7 at least 10 times every 1-2 hours when you are awake. Take your time and take a few normal breaths between deep breaths. 11. The spirometer may include an indicator to show your best effort. Use the indicator as a goal to work  toward during each repetition. 12. After each set of 10 deep breaths, practice coughing to be sure your lungs are clear. If you have an incision (the cut made at the time of surgery), support your incision when coughing by placing a pillow or rolled up towels firmly against it. Once you are able to get out of bed, walk around indoors and cough well. You may stop using the incentive spirometer when instructed by your caregiver.  RISKS AND COMPLICATIONS  Take your time so you do not get dizzy or light-headed.  If you are in pain, you may need to  take or ask for pain medication before doing incentive spirometry. It is harder to take a deep breath if you are having pain. AFTER USE  Rest and breathe slowly and easily.  It can be helpful to keep track of a log of your progress. Your caregiver can provide you with a simple table to help with this. If you are using the spirometer at home, follow these instructions: [place] IF:   You are having difficultly using the spirometer.  You have trouble using the spirometer as often as instructed.  Your pain medication is not giving enough relief while using the spirometer.  You develop fever of 100.5 F (38.1 C) or higher. SEEK IMMEDIATE MEDICAL CARE IF:   You cough up bloody sputum that had not been present before.  You develop fever of 102 F (38.9 C) or greater.  You develop worsening pain at or near the incision site. MAKE SURE YOU:   Understand these instructions.  Will watch your condition.  Will get help right away if you are not doing well or get worse. Document Released: 07/23/2006 Document Revised: 06/04/2011 Document Reviewed: 09/23/2006 [facility] Patient Information 2014 [place], [place].   ________________________________________________________________________

## 2014-10-15 NOTE — Progress Notes (Addendum)
10-15-14 - Lab results (BMP) from preop visit on 10-15-14, faxed to Dr. Charlann Boxer via [facility] 10-15-14 - Lab results UA from preop visit 10-15-14, faxed to Dr. Charlann Boxer via [facility]

## 2014-10-24 MED ORDER — DEXTROSE 5 % IV SOLN
3.0000 g | INTRAVENOUS | Status: AC
  Administered 2014-10-25: 3 g via INTRAVENOUS
  Filled 2014-10-24: qty 3000

## 2014-10-24 NOTE — H&P (Signed)
Frank Hensley is an 49 y.o. male.    Chief Complaint:    Painful left total knee arthroplasty   Procedure:     Open saphenous nervectomy, open scar debridement and poly exchange of the left total knee arthroplasty  HPI: Pt is a 49 y.o. male complaining of left knee pain for 4 years. Pain had continually increased since the beginning. X-rays in the clinic show previous TKA of the left knee. Pt has tried various conservative treatments which have failed to alleviate their symptoms, his symptoms include pain, popping and swelling.   Patient complains of pain in the left knee all the time and it even wakes him up at night, however the worse pain is with weight bearing and ROM.  Various options are discussed with the patient. Risks, benefits and expectations were discussed with the patient. Patient understand the risks, benefits and expectations and wishes to proceed with surgery.    PCP: Frank November, NP  D/C Plans:      Home with HHPT  Post-op Meds:       No Rx given  Tranexamic Acid:      To be given - IV  Decadron:      Is to be given  FYI:     ASA post-op  Oxycodone post-op   PMH: Past Medical History  Diagnosis Date  . Diabetes mellitus without complication   . GERD (gastroesophageal reflux disease)   . Anginal pain     hx. of; heart attaack was ruled out (20 yrs. ago)  . Hypertension   . Sleep apnea   . Arthritis     PSH: Past Surgical History  Procedure Laterality Date  . Tonsillectomy    . Appendectomy    . Joint replacement      left knee  . Back surgery    . Left knee arthroscopy      x 2   . Right shoulder      x2  . Right knee      x3 total -ligament, and cartilage x 2  . Lumbar laminectomy/decompression microdiscectomy Left 12/30/2013    Procedure: LUMBAR DECOMPRESSION L2-3, REDO L3-4 ;  Surgeon: Frank Docker, MD;  Location: [facility];  Service: Orthopedics;  Laterality: Left;    Social History:  reports that he has never smoked. He has never  used smokeless tobacco. He reports that he does not drink alcohol or use illicit drugs.  Allergies:  No Known Allergies  Medications: Current Facility-Administered Medications  Medication Dose Route Frequency Provider Last Rate Last Dose  . [START ON 10/25/2014] ceFAZolin (ANCEF) 3 g in dextrose 5 % 50 mL IVPB  3 g Intravenous On Call to OR Frank Romans, MD       Current Outpatient Prescriptions  Medication Sig Dispense Refill  . atorvastatin (LIPITOR) 20 MG tablet Take 20 mg by mouth daily.    Marland Kitchen gabapentin (NEURONTIN) 300 MG capsule Take 900-1,200 mg by mouth 3 (three) times daily.    Marland Kitchen ibuprofen (ADVIL,MOTRIN) 800 MG tablet Take 1 tablet (800 mg total) by mouth 3 (three) times daily as needed (back pain.). 30 tablet 0  . lisinopril (PRINIVIL,ZESTRIL) 20 MG tablet Take 20 mg by mouth every morning.    . metFORMIN (GLUCOPHAGE) 1000 MG tablet Take 1,000 mg by mouth 2 (two) times daily with a meal.    . oxyCODONE-acetaminophen (PERCOCET/ROXICET) 5-325 MG per tablet Take 1 tablet by mouth every 4 (four) hours as needed for moderate pain or  severe pain.    Marland Kitchen docusate sodium (COLACE) 100 MG capsule Take 1 capsule (100 mg total) by mouth 2 (two) times daily as needed for mild constipation. (Patient not taking: Reported on 10/12/2014) 20 capsule 1  . methocarbamol (ROBAXIN) 500 MG tablet Take 1 tablet (500 mg total) by mouth 3 (three) times daily between meals as needed for muscle spasms. (Patient not taking: Reported on 10/12/2014) 40 tablet 1  . oxyCODONE-acetaminophen (PERCOCET) 7.5-325 MG per tablet Take 1 tablet by mouth every 4 (four) hours as needed for pain. (Patient not taking: Reported on 10/12/2014) 60 tablet 0      Review of Systems  Constitutional: Negative.   HENT: Negative.   Eyes: Negative.   Respiratory: Negative.   Cardiovascular: Negative.   Gastrointestinal: Positive for heartburn.  Genitourinary: Negative.   Musculoskeletal: Positive for joint pain.  Skin: Negative.     Neurological: Negative.   Endo/Heme/Allergies: Negative.   Psychiatric/Behavioral: Negative.       Physical Exam  Constitutional: He is oriented to person, place, and time. He appears well-developed and well-nourished.  HENT:  Head: Normocephalic.  Eyes: Pupils are equal, round, and reactive to light.  Neck: Neck supple. No JVD present. No tracheal deviation present. No thyromegaly present.  Cardiovascular: Normal rate, regular rhythm, normal heart sounds and intact distal pulses.   Respiratory: Effort normal and breath sounds normal. No stridor. No respiratory distress. He has no wheezes.  GI: Soft. There is no tenderness. There is no guarding.  Musculoskeletal:       Left knee: He exhibits decreased range of motion, swelling and laceration (well healed previous incision). He exhibits no ecchymosis, no deformity, no erythema and normal alignment. Tenderness found.  Lymphadenopathy:    He has no cervical adenopathy.  Neurological: He is alert and oriented to person, place, and time. A sensory deficit (bilateral LE neuropathy) is present.  Skin: Skin is warm and dry.  Psychiatric: He has a normal mood and affect.       Assessment/Plan Assessment:     Painful left total knee arthroplasty   Plan: Patient will undergo a open saphenous nervectomy, open scar debridement and poly exchange of the left total knee arthroplasty on 10/25/2014 per Frank Hensley at [facility]. Risks benefits and expectations were discussed with the patient. Patient understand risks, benefits and expectations and wishes to proceed.   Frank Auerbach Libertie Hausler   PA-C  10/24/2014, 9:47 PM

## 2014-10-25 ENCOUNTER — Ambulatory Visit (HOSPITAL_COMMUNITY): Payer: Worker's Compensation | Admitting: Certified Registered Nurse Anesthetist

## 2014-10-25 ENCOUNTER — Encounter (HOSPITAL_COMMUNITY): Payer: Self-pay | Admitting: *Deleted

## 2014-10-25 ENCOUNTER — Encounter (HOSPITAL_COMMUNITY)
Admission: RE | Disposition: A | Discharge status: Home / Self Care | Payer: Self-pay | Source: Ambulatory Visit | Attending: Orthopedic Surgery

## 2014-10-25 ENCOUNTER — Inpatient Hospital Stay (HOSPITAL_COMMUNITY)
Admission: RE | Admit: 2014-10-25 | Discharge: 2014-10-26 | DRG: 464 | Disposition: A | Discharge status: Home / Self Care | Payer: Worker's Compensation | Source: Ambulatory Visit | Attending: Orthopedic Surgery | Admitting: Orthopedic Surgery

## 2014-10-25 DIAGNOSIS — Z79899 Other long term (current) drug therapy: Secondary | ICD-10-CM

## 2014-10-25 DIAGNOSIS — E119 Type 2 diabetes mellitus without complications: Secondary | ICD-10-CM | POA: Diagnosis present

## 2014-10-25 DIAGNOSIS — Z96659 Presence of unspecified artificial knee joint: Secondary | ICD-10-CM

## 2014-10-25 DIAGNOSIS — L905 Scar conditions and fibrosis of skin: Secondary | ICD-10-CM | POA: Diagnosis present

## 2014-10-25 DIAGNOSIS — K219 Gastro-esophageal reflux disease without esophagitis: Secondary | ICD-10-CM | POA: Diagnosis present

## 2014-10-25 DIAGNOSIS — Z791 Long term (current) use of non-steroidal anti-inflammatories (NSAID): Secondary | ICD-10-CM

## 2014-10-25 DIAGNOSIS — M1990 Unspecified osteoarthritis, unspecified site: Secondary | ICD-10-CM | POA: Diagnosis present

## 2014-10-25 DIAGNOSIS — I10 Essential (primary) hypertension: Secondary | ICD-10-CM | POA: Diagnosis present

## 2014-10-25 DIAGNOSIS — M25562 Pain in left knee: Secondary | ICD-10-CM | POA: Diagnosis present

## 2014-10-25 DIAGNOSIS — Z6841 Body Mass Index (BMI) 40.0 and over, adult: Secondary | ICD-10-CM

## 2014-10-25 DIAGNOSIS — T8484XA Pain due to internal orthopedic prosthetic devices, implants and grafts, initial encounter: Secondary | ICD-10-CM | POA: Diagnosis present

## 2014-10-25 DIAGNOSIS — Y831 Surgical operation with implant of artificial internal device as the cause of abnormal reaction of the patient, or of later complication, without mention of misadventure at the time of the procedure: Secondary | ICD-10-CM | POA: Diagnosis present

## 2014-10-25 DIAGNOSIS — Z79891 Long term (current) use of opiate analgesic: Secondary | ICD-10-CM | POA: Diagnosis not present

## 2014-10-25 DIAGNOSIS — G4730 Sleep apnea, unspecified: Secondary | ICD-10-CM | POA: Diagnosis present

## 2014-10-25 DIAGNOSIS — Z96652 Presence of left artificial knee joint: Secondary | ICD-10-CM | POA: Diagnosis present

## 2014-10-25 HISTORY — PX: I&D KNEE WITH POLY EXCHANGE: SHX5024

## 2014-10-25 LAB — TYPE AND SCREEN
ABO/RH(D): O POS
ANTIBODY SCREEN: NEGATIVE

## 2014-10-25 LAB — GLUCOSE, CAPILLARY
GLUCOSE-CAPILLARY: 235 mg/dL — AB (ref 65–99)
Glucose-Capillary: 188 mg/dL — ABNORMAL HIGH (ref 65–99)
Glucose-Capillary: 163 mg/dL — ABNORMAL HIGH (ref 65–99)
Glucose-Capillary: 281 mg/dL — ABNORMAL HIGH (ref 65–99)

## 2014-10-25 SURGERY — IRRIGATION AND DEBRIDEMENT KNEE WITH POLY EXCHANGE
Anesthesia: General | Site: Knee

## 2014-10-25 MED ORDER — CELECOXIB 200 MG PO CAPS
200.0000 mg | ORAL_CAPSULE | Freq: Two times a day (BID) | ORAL | Status: DC
  Administered 2014-10-25 – 2014-10-26 (×2): 200 mg via ORAL
  Filled 2014-10-25 (×3): qty 1

## 2014-10-25 MED ORDER — PROMETHAZINE HCL 25 MG/ML IJ SOLN: 6.2500 mg | INTRAMUSCULAR | Status: DC | PRN

## 2014-10-25 MED ORDER — PROPOFOL 10 MG/ML IV BOLUS
INTRAVENOUS | Status: DC | PRN
  Administered 2014-10-25: 200 mg via INTRAVENOUS

## 2014-10-25 MED ORDER — MENTHOL 3 MG MT LOZG: 1.0000 | LOZENGE | OROMUCOSAL | Status: DC | PRN

## 2014-10-25 MED ORDER — ALUM & MAG HYDROXIDE-SIMETH 200-200-20 MG/5ML PO SUSP
30.0000 mL | ORAL | Status: DC | PRN
  Administered 2014-10-26: 30 mL via ORAL
  Filled 2014-10-25: qty 30

## 2014-10-25 MED ORDER — TRANEXAMIC ACID 1000 MG/10ML IV SOLN
1000.0000 mg | Freq: Once | INTRAVENOUS | Status: AC
  Administered 2014-10-25: 1000 mg via INTRAVENOUS
  Filled 2014-10-25: qty 10

## 2014-10-25 MED ORDER — ONDANSETRON HCL 4 MG/2ML IJ SOLN
INTRAMUSCULAR | Status: AC
Start: 2014-10-25 — End: 2014-10-25
  Filled 2014-10-25: qty 2

## 2014-10-25 MED ORDER — MIDAZOLAM HCL 5 MG/5ML IJ SOLN
INTRAMUSCULAR | Status: DC | PRN
  Administered 2014-10-25: 2 mg via INTRAVENOUS

## 2014-10-25 MED ORDER — ONDANSETRON HCL 4 MG PO TABS: 4.0000 mg | ORAL_TABLET | Freq: Four times a day (QID) | ORAL | Status: DC | PRN

## 2014-10-25 MED ORDER — PROPOFOL 10 MG/ML IV BOLUS
INTRAVENOUS | Status: AC
  Filled 2014-10-25: qty 20

## 2014-10-25 MED ORDER — ONDANSETRON HCL 4 MG/2ML IJ SOLN: 4.0000 mg | Freq: Four times a day (QID) | INTRAMUSCULAR | Status: DC | PRN

## 2014-10-25 MED ORDER — DEXAMETHASONE SODIUM PHOSPHATE 10 MG/ML IJ SOLN
10.0000 mg | Freq: Once | INTRAMUSCULAR | Status: AC
  Administered 2014-10-26: 10 mg via INTRAVENOUS
  Filled 2014-10-25: qty 1

## 2014-10-25 MED ORDER — MAGNESIUM CITRATE PO SOLN: 1.0000 | Freq: Once | ORAL | Status: AC | PRN

## 2014-10-25 MED ORDER — FERROUS SULFATE 325 (65 FE) MG PO TABS
325.0000 mg | ORAL_TABLET | Freq: Three times a day (TID) | ORAL | Status: DC
  Administered 2014-10-25 – 2014-10-26 (×2): 325 mg via ORAL
  Filled 2014-10-25 (×5): qty 1

## 2014-10-25 MED ORDER — SODIUM CHLORIDE 0.9 % IV SOLN
INTRAVENOUS | Status: DC
  Administered 2014-10-25: 14:00:00 via INTRAVENOUS
  Filled 2014-10-25 (×4): qty 1000

## 2014-10-25 MED ORDER — GABAPENTIN 300 MG PO CAPS
900.0000 mg | ORAL_CAPSULE | Freq: Three times a day (TID) | ORAL | Status: DC
  Administered 2014-10-25 – 2014-10-26 (×3): 900 mg via ORAL
  Filled 2014-10-25 (×5): qty 3

## 2014-10-25 MED ORDER — DOCUSATE SODIUM 100 MG PO CAPS
100.0000 mg | ORAL_CAPSULE | Freq: Two times a day (BID) | ORAL | Status: DC
  Administered 2014-10-25 – 2014-10-26 (×2): 100 mg via ORAL

## 2014-10-25 MED ORDER — INSULIN ASPART 100 UNIT/ML ~~LOC~~ SOLN
0.0000 [IU] | Freq: Three times a day (TID) | SUBCUTANEOUS | Status: DC
  Administered 2014-10-25: 8 [IU] via SUBCUTANEOUS
  Administered 2014-10-26: 3 [IU] via SUBCUTANEOUS

## 2014-10-25 MED ORDER — HYDROMORPHONE HCL 1 MG/ML IJ SOLN
INTRAMUSCULAR | Status: DC | PRN
  Administered 2014-10-25 (×2): 1 mg via INTRAVENOUS

## 2014-10-25 MED ORDER — HYDROMORPHONE HCL 1 MG/ML IJ SOLN
INTRAMUSCULAR | Status: AC
  Filled 2014-10-25: qty 1

## 2014-10-25 MED ORDER — SODIUM CHLORIDE 0.9 % IJ SOLN
INTRAMUSCULAR | Status: AC
Start: 2014-10-25 — End: 2014-10-25
  Filled 2014-10-25: qty 50

## 2014-10-25 MED ORDER — OXYCODONE HCL 5 MG PO TABS
5.0000 mg | ORAL_TABLET | ORAL | Status: DC
  Administered 2014-10-25: 15 mg via ORAL
  Administered 2014-10-25 (×2): 10 mg via ORAL
  Administered 2014-10-26 (×3): 15 mg via ORAL
  Filled 2014-10-25: qty 3
  Filled 2014-10-25 (×2): qty 2
  Filled 2014-10-25 (×2): qty 3
  Filled 2014-10-25: qty 1
  Filled 2014-10-25: qty 3

## 2014-10-25 MED ORDER — BUPIVACAINE-EPINEPHRINE (PF) 0.25% -1:200000 IJ SOLN
INTRAMUSCULAR | Status: AC
  Filled 2014-10-25: qty 30

## 2014-10-25 MED ORDER — HYDROMORPHONE HCL 2 MG/ML IJ SOLN
INTRAMUSCULAR | Status: AC
  Filled 2014-10-25: qty 1

## 2014-10-25 MED ORDER — BUPIVACAINE-EPINEPHRINE (PF) 0.25% -1:200000 IJ SOLN
INTRAMUSCULAR | Status: DC | PRN
  Administered 2014-10-25: 30 mL

## 2014-10-25 MED ORDER — ATORVASTATIN CALCIUM 20 MG PO TABS
20.0000 mg | ORAL_TABLET | Freq: Every day | ORAL | Status: DC
  Administered 2014-10-25: 20 mg via ORAL
  Filled 2014-10-25 (×2): qty 1

## 2014-10-25 MED ORDER — METHOCARBAMOL 500 MG PO TABS
500.0000 mg | ORAL_TABLET | Freq: Four times a day (QID) | ORAL | Status: DC | PRN
  Administered 2014-10-25: 500 mg via ORAL
  Filled 2014-10-25 (×2): qty 1

## 2014-10-25 MED ORDER — LISINOPRIL 20 MG PO TABS
20.0000 mg | ORAL_TABLET | Freq: Every day | ORAL | Status: DC
  Filled 2014-10-25: qty 1

## 2014-10-25 MED ORDER — HYDROMORPHONE HCL 1 MG/ML IJ SOLN
0.2500 mg | INTRAMUSCULAR | Status: DC | PRN
  Administered 2014-10-25 (×4): 0.5 mg via INTRAVENOUS

## 2014-10-25 MED ORDER — SODIUM CHLORIDE 0.9 % IJ SOLN
INTRAMUSCULAR | Status: DC | PRN
  Administered 2014-10-25: 30 mL

## 2014-10-25 MED ORDER — ASPIRIN EC 325 MG PO TBEC
325.0000 mg | DELAYED_RELEASE_TABLET | Freq: Two times a day (BID) | ORAL | Status: DC
  Administered 2014-10-26: 325 mg via ORAL
  Filled 2014-10-25 (×3): qty 1

## 2014-10-25 MED ORDER — CEFAZOLIN SODIUM-DEXTROSE 2-3 GM-% IV SOLR
2.0000 g | Freq: Four times a day (QID) | INTRAVENOUS | Status: AC
  Administered 2014-10-25 (×2): 2 g via INTRAVENOUS
  Filled 2014-10-25 (×2): qty 50

## 2014-10-25 MED ORDER — SODIUM CHLORIDE 0.9 % IR SOLN
Status: DC | PRN
  Administered 2014-10-25: 1000 mL

## 2014-10-25 MED ORDER — METHOCARBAMOL 1000 MG/10ML IJ SOLN
500.0000 mg | Freq: Four times a day (QID) | INTRAMUSCULAR | Status: DC | PRN
  Administered 2014-10-25: 500 mg via INTRAVENOUS
  Filled 2014-10-25 (×2): qty 5

## 2014-10-25 MED ORDER — METOCLOPRAMIDE HCL 10 MG PO TABS: 5.0000 mg | ORAL_TABLET | Freq: Three times a day (TID) | ORAL | Status: DC | PRN

## 2014-10-25 MED ORDER — POLYETHYLENE GLYCOL 3350 17 G PO PACK
17.0000 g | PACK | Freq: Two times a day (BID) | ORAL | Status: DC
  Administered 2014-10-25 – 2014-10-26 (×2): 17 g via ORAL

## 2014-10-25 MED ORDER — DEXAMETHASONE SODIUM PHOSPHATE 10 MG/ML IJ SOLN
10.0000 mg | Freq: Once | INTRAMUSCULAR | Status: AC
  Administered 2014-10-25: 10 mg via INTRAVENOUS

## 2014-10-25 MED ORDER — KETOROLAC TROMETHAMINE 30 MG/ML IJ SOLN
INTRAMUSCULAR | Status: AC
Start: 2014-10-25 — End: 2014-10-25
  Filled 2014-10-25: qty 1

## 2014-10-25 MED ORDER — FENTANYL CITRATE (PF) 100 MCG/2ML IJ SOLN
INTRAMUSCULAR | Status: DC | PRN
  Administered 2014-10-25 (×5): 50 ug via INTRAVENOUS

## 2014-10-25 MED ORDER — DEXAMETHASONE SODIUM PHOSPHATE 10 MG/ML IJ SOLN
INTRAMUSCULAR | Status: AC
Start: 2014-10-25 — End: 2014-10-25
  Filled 2014-10-25: qty 1

## 2014-10-25 MED ORDER — CHLORHEXIDINE GLUCONATE 4 % EX LIQD: 60.0000 mL | Freq: Once | CUTANEOUS | Status: DC

## 2014-10-25 MED ORDER — KETAMINE HCL 10 MG/ML IJ SOLN
INTRAMUSCULAR | Status: DC | PRN
  Administered 2014-10-25: 10 mg via INTRAVENOUS
  Administered 2014-10-25: 20 mg via INTRAVENOUS

## 2014-10-25 MED ORDER — LIDOCAINE HCL (CARDIAC) 20 MG/ML IV SOLN
INTRAVENOUS | Status: DC | PRN
  Administered 2014-10-25: 50 mg via INTRAVENOUS

## 2014-10-25 MED ORDER — METOCLOPRAMIDE HCL 5 MG/ML IJ SOLN: 5.0000 mg | Freq: Three times a day (TID) | INTRAMUSCULAR | Status: DC | PRN

## 2014-10-25 MED ORDER — MIDAZOLAM HCL 2 MG/2ML IJ SOLN
INTRAMUSCULAR | Status: AC
  Filled 2014-10-25: qty 2

## 2014-10-25 MED ORDER — LACTATED RINGERS IV SOLN
INTRAVENOUS | Status: DC
  Administered 2014-10-25: 1000 mL via INTRAVENOUS
  Administered 2014-10-25: 10:00:00 via INTRAVENOUS

## 2014-10-25 MED ORDER — FENTANYL CITRATE (PF) 250 MCG/5ML IJ SOLN
INTRAMUSCULAR | Status: AC
  Filled 2014-10-25: qty 5

## 2014-10-25 MED ORDER — LIDOCAINE HCL (CARDIAC) 20 MG/ML IV SOLN
INTRAVENOUS | Status: AC
  Filled 2014-10-25: qty 5

## 2014-10-25 MED ORDER — SUCCINYLCHOLINE CHLORIDE 20 MG/ML IJ SOLN
INTRAMUSCULAR | Status: DC | PRN
  Administered 2014-10-25: 100 mg via INTRAVENOUS

## 2014-10-25 MED ORDER — METOCLOPRAMIDE HCL 5 MG/ML IJ SOLN
INTRAMUSCULAR | Status: AC
Start: 2014-10-25 — End: 2014-10-25
  Filled 2014-10-25: qty 2

## 2014-10-25 MED ORDER — METFORMIN HCL 500 MG PO TABS
1000.0000 mg | ORAL_TABLET | Freq: Two times a day (BID) | ORAL | Status: DC
  Administered 2014-10-25 – 2014-10-26 (×2): 1000 mg via ORAL
  Filled 2014-10-25 (×4): qty 2

## 2014-10-25 MED ORDER — KETAMINE HCL 10 MG/ML IJ SOLN
INTRAMUSCULAR | Status: AC
  Filled 2014-10-25: qty 1

## 2014-10-25 MED ORDER — HYDROMORPHONE HCL 1 MG/ML IJ SOLN
0.5000 mg | INTRAMUSCULAR | Status: DC | PRN
  Administered 2014-10-25 (×2): 1 mg via INTRAVENOUS
  Filled 2014-10-25 (×2): qty 1

## 2014-10-25 MED ORDER — BISACODYL 10 MG RE SUPP: 10.0000 mg | Freq: Every day | RECTAL | Status: DC | PRN

## 2014-10-25 MED ORDER — KETOROLAC TROMETHAMINE 30 MG/ML IJ SOLN
INTRAMUSCULAR | Status: DC | PRN
  Administered 2014-10-25: 30 mg

## 2014-10-25 MED ORDER — PHENOL 1.4 % MT LIQD: 1.0000 | OROMUCOSAL | Status: DC | PRN

## 2014-10-25 SURGICAL SUPPLY — 64 items
BAG SPEC THK2 15X12 ZIP CLS (MISCELLANEOUS) ×1
BAG ZIPLOCK 12X15 (MISCELLANEOUS) ×3 IMPLANT
BANDAGE ELASTIC 4 VELCRO ST LF (GAUZE/BANDAGES/DRESSINGS) ×1 IMPLANT
BANDAGE ELASTIC 6 VELCRO ST LF (GAUZE/BANDAGES/DRESSINGS) ×3 IMPLANT
BANDAGE ESMARK 6X9 LF (GAUZE/BANDAGES/DRESSINGS) ×1 IMPLANT
BNDG CMPR 9X6 STRL LF SNTH (GAUZE/BANDAGES/DRESSINGS) ×1
BNDG ESMARK 6X9 LF (GAUZE/BANDAGES/DRESSINGS) ×3
CLOSURE WOUND 1/2 X4 (GAUZE/BANDAGES/DRESSINGS) ×1
CUFF TOURN SGL QUICK 34 (TOURNIQUET CUFF) ×3
CUFF TRNQT CYL 34X4X40X1 (TOURNIQUET CUFF) ×1 IMPLANT
DECANTER SPIKE VIAL GLASS SM (MISCELLANEOUS) ×3 IMPLANT
DRAPE EXTREMITY T 121X128X90 (DRAPE) ×3 IMPLANT
DRAPE POUCH INSTRU U-SHP 10X18 (DRAPES) ×3 IMPLANT
DRAPE U-SHAPE 47X51 STRL (DRAPES) ×3 IMPLANT
DRSG ADAPTIC 3X8 NADH LF (GAUZE/BANDAGES/DRESSINGS) ×1 IMPLANT
DRSG AQUACEL AG ADV 3.5X10 (GAUZE/BANDAGES/DRESSINGS) ×2 IMPLANT
DRSG PAD ABDOMINAL 8X10 ST (GAUZE/BANDAGES/DRESSINGS) ×2 IMPLANT
DURAPREP 26ML APPLICATOR (WOUND CARE) ×3 IMPLANT
ELECT REM PT RETURN 9FT ADLT (ELECTROSURGICAL) ×3
ELECTRODE REM PT RTRN 9FT ADLT (ELECTROSURGICAL) ×1 IMPLANT
EVACUATOR 1/8 PVC DRAIN (DRAIN) ×3 IMPLANT
FACESHIELD WRAPAROUND (MASK) ×15 IMPLANT
FACESHIELD WRAPAROUND OR TEAM (MASK) ×5 IMPLANT
FLOSEAL 10ML (HEMOSTASIS) IMPLANT
GAUZE SPONGE 4X4 12PLY STRL (GAUZE/BANDAGES/DRESSINGS) IMPLANT
GLOVE BIOGEL PI IND STRL 7.5 (GLOVE) ×1 IMPLANT
GLOVE BIOGEL PI IND STRL 8.5 (GLOVE) ×1 IMPLANT
GLOVE BIOGEL PI INDICATOR 7.5 (GLOVE) ×2
GLOVE BIOGEL PI INDICATOR 8.5 (GLOVE) ×2
GLOVE ECLIPSE 8.0 STRL XLNG CF (GLOVE) IMPLANT
GLOVE ORTHO TXT STRL SZ7.5 (GLOVE) ×6 IMPLANT
GLOVE SURG ORTHO 8.0 STRL STRW (GLOVE) ×3 IMPLANT
GOWN SPEC L3 XXLG W/TWL (GOWN DISPOSABLE) ×6 IMPLANT
GOWN STRL REUS W/TWL LRG LVL3 (GOWN DISPOSABLE) ×3 IMPLANT
HANDPIECE INTERPULSE COAX TIP (DISPOSABLE) ×3
IMMOBILIZER KNEE 20 (SOFTGOODS)
IMMOBILIZER KNEE 20 THIGH 36 (SOFTGOODS) IMPLANT
KIT BASIN OR (CUSTOM PROCEDURE TRAY) ×3 IMPLANT
LIQUID BAND (GAUZE/BANDAGES/DRESSINGS) ×2 IMPLANT
MANIFOLD NEPTUNE II (INSTRUMENTS) ×3 IMPLANT
NDL SAFETY ECLIPSE 18X1.5 (NEEDLE) ×1 IMPLANT
NEEDLE HYPO 18GX1.5 SHARP (NEEDLE) ×3
NS IRRIG 1000ML POUR BTL (IV SOLUTION) ×6 IMPLANT
PACK TOTAL JOINT (CUSTOM PROCEDURE TRAY) ×3 IMPLANT
PADDING CAST COTTON 6X4 STRL (CAST SUPPLIES) ×2 IMPLANT
PLATE ROT INSERT 12.5MM (Plate) ×2 IMPLANT
POSITIONER SURGICAL ARM (MISCELLANEOUS) ×3 IMPLANT
SET HNDPC FAN SPRY TIP SCT (DISPOSABLE) ×1 IMPLANT
SPONGE LAP 18X18 X RAY DECT (DISPOSABLE) ×3 IMPLANT
STRIP CLOSURE SKIN 1/2X4 (GAUZE/BANDAGES/DRESSINGS) ×3 IMPLANT
SUCTION FRAZIER 12FR DISP (SUCTIONS) ×3 IMPLANT
SUT MNCRL AB 4-0 PS2 18 (SUTURE) ×3 IMPLANT
SUT VIC AB 1 CT1 36 (SUTURE) ×11 IMPLANT
SUT VIC AB 2-0 CT1 27 (SUTURE) ×9
SUT VIC AB 2-0 CT1 TAPERPNT 27 (SUTURE) ×3 IMPLANT
SWAB COLLECTION DEVICE MRSA (MISCELLANEOUS) ×1 IMPLANT
SYR 50ML LL SCALE MARK (SYRINGE) ×3 IMPLANT
SYR CONTROL 10ML LL (SYRINGE) ×3 IMPLANT
TOWEL OR 17X26 10 PK STRL BLUE (TOWEL DISPOSABLE) ×6 IMPLANT
TRAY FOLEY W/METER SILVER 14FR (SET/KITS/TRAYS/PACK) IMPLANT
TRAY FOLEY W/METER SILVER 16FR (SET/KITS/TRAYS/PACK) ×2 IMPLANT
TUBE ANAEROBIC SPECIMEN COL (MISCELLANEOUS) ×1 IMPLANT
WATER STERILE IRR 1500ML POUR (IV SOLUTION) ×3 IMPLANT
WRAP KNEE MAXI GEL POST OP (GAUZE/BANDAGES/DRESSINGS) ×3 IMPLANT

## 2014-10-25 NOTE — Discharge Instructions (Signed)

## 2014-10-25 NOTE — Transfer of Care (Signed)
Immediate Anesthesia Transfer of Care Note  Patient: Frank Hensley  Procedure(s) Performed: Procedure(s): OPEN SAPHENOUS NEURECTOMY OPEN SCAR DEBRIDEMENT AND POLY EXCHANGE (N/A)  Patient Location: PACU  Anesthesia Type:General  Level of Consciousness: Patient easily awoken, sedated, comfortable, cooperative, following commands, responds to stimulation.   Airway & Oxygen Therapy: Patient spontaneously breathing, ventilating well, oxygen via simple oxygen mask.  Post-op Assessment: Report given to PACU RN, vital signs reviewed and stable, moving all extremities.   Post vital signs: Reviewed and stable.  Complications: No apparent anesthesia complications

## 2014-10-25 NOTE — Interval H&P Note (Signed)
History and Physical Interval Note:  10/25/2014 8:46 AM  Frank Hensley  has presented today for surgery, with the diagnosis of PAINFUL LEFT TOTAL KNEE  The various methods of treatment have been discussed with the patient and family. After consideration of risks, benefits and other options for treatment, the patient has consented to  Procedure(s): OPEN SAPHENOUS NEURECTOMY OPEN SCAR DEBRIDEMENT AND POLY EXCHANGE (N/A) as a surgical intervention .  The patient's history has been reviewed, patient examined, no change in status, stable for surgery.  I have reviewed the patient's chart and labs.  Questions were answered to the patient's satisfaction.     [name]

## 2014-10-25 NOTE — Brief Op Note (Signed)
10/25/2014  10:58 AM  PATIENT:  Frank Hensley  49 y.o. male  PRE-OPERATIVE DIAGNOSIS:  PAINFUL LEFT TOTAL KNEE replacement, painful saphenous neuroma post surgical   POST-OPERATIVE DIAGNOSIS:  PAINFUL LEFT TOTAL KNEE replacement, painful saphenous neuroma post surgical  PROCEDURE:  Procedure(s): OPEN SAPHENOUS NEURECTOMY OPEN SCAR DEBRIDEMENT AND POLY EXCHANGE (N/A)  SURGEON:  Surgeon(s) and Role:    * Durene Romans, MD - Primary  PHYSICIAN ASSISTANT: Lanney Gins, PA-C  ANESTHESIA:   general  EBL:  Total I/O In: 1000 [I.V.:1000] Out: 200 [Urine:200]  BLOOD ADMINISTERED:none  DRAINS: none   LOCAL MEDICATIONS USED:  MARCAINE     SPECIMEN:  No Specimen  DISPOSITION OF SPECIMEN:  N/A  COUNTS:  YES  TOURNIQUET:   Total Tourniquet Time Documented: Thigh (Left) - 22 minutes Total: Thigh (Left) - 22 minutes   DICTATION: .Other Dictation: Dictation Number [id number]  PLAN OF CARE: Admit for overnight observation  PATIENT DISPOSITION:  PACU - hemodynamically stable.   Delay start of Pharmacological VTE agent (>24hrs) due to surgical blood loss or risk of bleeding: no

## 2014-10-25 NOTE — Anesthesia Procedure Notes (Signed)
Procedure Name: Intubation Date/Time: 10/25/2014 9:42 AM Performed by: Ludwig Lean Pre-anesthesia Checklist: Patient identified, Emergency Drugs available, Suction available and Patient being monitored Patient Re-evaluated:Patient Re-evaluated prior to inductionOxygen Delivery Method: Circle System Utilized Preoxygenation: Pre-oxygenation with 100% oxygen Intubation Type: IV induction Ventilation: Mask ventilation without difficulty Laryngoscope Size: Glidescope and 4 Grade View: Grade I Tube type: Oral Tube size: 8.0 mm Number of attempts: 1 Airway Equipment and Method: Stylet and Oral airway Placement Confirmation: ETT inserted through vocal cords under direct vision,  positive ETCO2 and breath sounds checked- equal and bilateral Secured at: 22 cm Tube secured with: Tape Dental Injury: Teeth and Oropharynx as per pre-operative assessment  Comments: Glidescope used electively due to previous intubation note.

## 2014-10-25 NOTE — Progress Notes (Signed)
Pt walked full length of hallway with nursing staff on standby. Pt sat in chair x 2.

## 2014-10-25 NOTE — Anesthesia Postprocedure Evaluation (Signed)
  Anesthesia Post-op Note  Patient: Frank Hensley  Procedure(s) Performed: Procedure(s) (LRB): OPEN SAPHENOUS NEURECTOMY OPEN SCAR DEBRIDEMENT AND POLY EXCHANGE (N/A)  Patient Location: PACU  Anesthesia Type: General  Level of Consciousness: awake and alert   Airway and Oxygen Therapy: Patient Spontanous Breathing  Post-op Pain: mild  Post-op Assessment: Post-op Vital signs reviewed, Patient's Cardiovascular Status Stable, Respiratory Function Stable, Patent Airway and No signs of Nausea or vomiting  Last Vitals:  Filed Vitals:   10/25/14 1230  BP:   Pulse: 96  Temp: 36.7 C  Resp: 10    Post-op Vital Signs: stable   Complications: No apparent anesthesia complications

## 2014-10-25 NOTE — Anesthesia Preprocedure Evaluation (Addendum)
Anesthesia Evaluation  Patient identified by MRN, date of birth, ID band Patient awake    Reviewed: Allergy & Precautions, NPO status , Patient's Chart, lab work & pertinent test results  Airway Mallampati: II  TM Distance: >3 FB Neck ROM: Full    Dental no notable dental hx.    Pulmonary sleep apnea ,  breath sounds clear to auscultation  Pulmonary exam normal       Cardiovascular hypertension, Pt. on medications + angina Normal cardiovascular examRhythm:Regular Rate:Normal     Neuro/Psych negative neurological ROS  negative psych ROS   GI/Hepatic Neg liver ROS, GERD-  ,  Endo/Other  diabetes, Type 2, Oral Hypoglycemic AgentsMorbid obesity  Renal/GU negative Renal ROS  negative genitourinary   Musculoskeletal  (+) Arthritis -,   Abdominal (+) + obese,   Peds negative pediatric ROS (+)  Hematology negative hematology ROS (+)   Anesthesia Other Findings   Reproductive/Obstetrics negative OB ROS                            Anesthesia Physical Anesthesia Plan  ASA: III  Anesthesia Plan: General   Post-op Pain Management:    Induction: Intravenous  Airway Management Planned: Oral ETT  Additional Equipment:   Intra-op Plan:   Post-operative Plan: Extubation in OR  Informed Consent: I have reviewed the patients History and Physical, chart, labs and discussed the procedure including the risks, benefits and alternatives for the proposed anesthesia with the patient or authorized representative who has indicated his/her understanding and acceptance.   Dental advisory given  Plan Discussed with: CRNA  Anesthesia Plan Comments:        Anesthesia Quick Evaluation

## 2014-10-26 ENCOUNTER — Encounter (HOSPITAL_COMMUNITY): Payer: Self-pay | Admitting: Orthopedic Surgery

## 2014-10-26 LAB — CBC
HEMATOCRIT: 35.1 % — AB (ref 39.0–52.0)
HEMOGLOBIN: 12.1 g/dL — AB (ref 13.0–17.0)
MCH: 28.4 pg (ref 26.0–34.0)
MCHC: 34.5 g/dL (ref 30.0–36.0)
MCV: 82.4 fL (ref 78.0–100.0)
Platelets: 144 10*3/uL — ABNORMAL LOW (ref 150–400)
RBC: 4.26 MIL/uL (ref 4.22–5.81)
RDW: 13.1 % (ref 11.5–15.5)
WBC: 11.5 10*3/uL — ABNORMAL HIGH (ref 4.0–10.5)

## 2014-10-26 LAB — BASIC METABOLIC PANEL
Anion gap: 7 (ref 5–15)
BUN: 16 mg/dL (ref 6–20)
CO2: 25 mmol/L (ref 22–32)
Calcium: 8.6 mg/dL — ABNORMAL LOW (ref 8.9–10.3)
Chloride: 105 mmol/L (ref 101–111)
Creatinine, Ser: 0.99 mg/dL (ref 0.61–1.24)
GFR calc Af Amer: >60 mL/min (ref >60)
Glucose, Bld: 213 mg/dL — ABNORMAL HIGH (ref 65–99)
Potassium: 4.5 mmol/L (ref 3.5–5.1)
SODIUM: 137 mmol/L (ref 135–145)

## 2014-10-26 LAB — GLUCOSE, CAPILLARY: GLUCOSE-CAPILLARY: 175 mg/dL — AB (ref 65–99)

## 2014-10-26 MED ORDER — POLYETHYLENE GLYCOL 3350 17 G PO PACK: 17.0000 g | PACK | Freq: Two times a day (BID) | ORAL | Status: DC

## 2014-10-26 MED ORDER — METHOCARBAMOL 500 MG PO TABS: 500.0000 mg | ORAL_TABLET | Freq: Four times a day (QID) | ORAL | Status: DC | PRN

## 2014-10-26 MED ORDER — ACETAMINOPHEN 325 MG PO TABS: 650.0000 mg | ORAL_TABLET | Freq: Four times a day (QID) | ORAL | Status: DC | PRN

## 2014-10-26 MED ORDER — DOCUSATE SODIUM 100 MG PO CAPS: 100.0000 mg | ORAL_CAPSULE | Freq: Two times a day (BID) | ORAL | Status: DC

## 2014-10-26 MED ORDER — OXYCODONE HCL 5 MG PO TABS: 5.0000 mg | ORAL_TABLET | ORAL | Status: DC | PRN

## 2014-10-26 MED ORDER — FERROUS SULFATE 325 (65 FE) MG PO TABS: 325.0000 mg | ORAL_TABLET | Freq: Three times a day (TID) | ORAL | Status: DC

## 2014-10-26 MED ORDER — ASPIRIN 325 MG PO TBEC: 325.0000 mg | DELAYED_RELEASE_TABLET | Freq: Two times a day (BID) | ORAL | Status: AC

## 2014-10-26 NOTE — Progress Notes (Signed)
Occupational Therapy Evaluation Patient Details Name: Frank Hensley MRN: [id number] DOB: [birth year] Today's Date: 10/26/2014    History of Present Illness 49 yo male s/p L TK revision 10/25/14. Hx of DM   Clinical Impression   All OT education completed and no further OT needs. OT will sign off.    Follow Up Recommendations  No OT follow up;Supervision - Intermittent    Equipment Recommendations  None recommended by OT    Recommendations for Other Services       Precautions / Restrictions Precautions Precautions: Knee Restrictions Weight Bearing Restrictions: No LLE Weight Bearing: Weight bearing as tolerated      Mobility Bed Mobility Overal bed mobility: Modified Independent                Transfers Overall transfer level: Modified independent                    Balance                                            ADL Overall ADL's : Needs assistance/impaired Eating/Feeding: Independent   Grooming: Wash/dry hands;Wash/dry face;Oral care;Independent   Upper Body Bathing: Set up;Sitting;Standing   Lower Body Bathing: Minimal assistance;Sit to/from stand   Upper Body Dressing : Set up;Supervision/safety;Sitting;Standing   Lower Body Dressing: Minimal assistance;Sit to/from stand   Toilet Transfer: Modified Independent;Ambulation;Regular Toilet;RW   Toileting- Clothing Manipulation and Hygiene: Modified independent;Sit to/from stand       Functional mobility during ADLs: Modified independent;Rolling walker General ADL Comments: Educated patient on how to perform tub/shower transfer safely and to have his wife with him. He verbalized understanding but did not practice. Educated patient on LB dressing techniques and he verbalized understanding. Wife will assist with LB bathing and dressing as needed. Educated patient on 3 in 1; patient feels like he has enough around the toilet (tub/vanity) to push up on if needed and declines  needing. All OT education completed. OT will sign off.     Vision     Perception     Praxis      Pertinent Vitals/Pain Pain Assessment: 0-10 Pain Score: 4  Pain Location: L knee Pain Descriptors / Indicators: Sore Pain Intervention(s): Monitored during session     Hand Dominance Right   Extremity/Trunk Assessment Upper Extremity Assessment Upper Extremity Assessment: Overall WFL for tasks assessed   Lower Extremity Assessment Lower Extremity Assessment: Defer to PT evaluation RLE Deficits / Details: at least: hip flex 3/5, knee ext 3/5, moves ankle well   Cervical / Trunk Assessment Cervical / Trunk Assessment: Normal   Communication Communication Communication: No difficulties   Cognition Arousal/Alertness: Awake/alert Behavior During Therapy: WFL for tasks assessed/performed Overall Cognitive Status: Within Functional Limits for tasks assessed                     General Comments       Exercises       Shoulder Instructions      Home Living Family/patient expects to be discharged to:: Private residence Living Arrangements: Spouse/significant other Available Help at Discharge: Family;Available 24 hours/day Type of Home: House Home Access: Stairs to enter [employer] of Steps: 1   Home Layout: Two level Alternate Level Stairs-Number of Steps: 9 Alternate Level Stairs-Rails: Right Bathroom Shower/Tub: Tub/shower unit Shower/tub characteristics: Engineer, building services: Standard  Home Equipment: None          Prior Functioning/Environment Level of Independence: Independent             OT Diagnosis: Acute pain   OT Problem List: Pain;Decreased range of motion   OT Treatment/Interventions:      OT Goals(Current goals can be found in the care plan section) Acute Rehab OT Goals Patient Stated Goal: regain independence. less pain. OT Goal Formulation: All assessment and education complete, DC therapy  OT Frequency:      Barriers to D/C:            Co-evaluation              End of Session Nurse Communication: Mobility status  Activity Tolerance: Patient tolerated treatment well Patient left: in bed;with call bell/phone within reach;with family/visitor present   Time: 1610-9604 OT Time Calculation (min): 10 min Charges:  OT General Charges $OT Visit: 1 Procedure OT Evaluation $Initial OT Evaluation Tier I: 1 Procedure G-Codes:    Frank Hensley 2014-11-22, 11:39 AM

## 2014-10-26 NOTE — Op Note (Signed)
NAME:  KEY, CEN NO.:  [id number]  MEDICAL RECORD NO.:  [id number]  LOCATION:  1607                         FACILITY:  [facility]  PHYSICIAN:  Madlyn Frankel. Charlann Boxer, M.D.  DATE OF BIRTH:  1965-09-16  DATE OF PROCEDURE:  10/25/2014 DATE OF DISCHARGE:                              OPERATIVE REPORT   PREOPERATIVE DIAGNOSES: 1. Painful left total knee arthroplasty. 2. Painful saphenous nerve branch postsurgical over the anteromedial     proximal tibia.  POSTOPERATIVE DIAGNOSES: 1. Painful left total knee arthroplasty. 2. Painful saphenous nerve branch postsurgical over the anteromedial     proximal tibia.  PROCEDURES: 1. Open left saphenous neurectomy.  Excision of saphenous nerve     branches over the anteromedial proximal tibia. 2. Open left knee scar excision with polyethylene exchange.     Polyethylene revision from the size 10 mm to 12.5 mm posterior     stabilized insert.  SURGEON:  Madlyn Frankel. Charlann Boxer, M.D.  ASSISTANT:  Lanney Gins, PA-C.  Note that Mr. Carmon Sails was present for the entirety of the case from preoperative position, perioperative management of the operative extremity, general facilitation of the case, and primary wound closure.  ANESTHESIA:  General.  SPECIMENS:  None.  COMPLICATIONS:  None.  TOURNIQUET TIME:  22 minutes at 250 mmHg.  ESTIMATED BLOOD LOSS:  Less than 20 mL.  INDICATIONS FOR PROCEDURE:  Mr. Bell is a 49 year old male with a history of total knee arthroplasty by myself about 4 years ago.  He has had some persistent discomfort in his knee over the years since it was done through worker's comp.  He has comorbid lower back issues which have been complicating his overall recovery.  In the office, we had diagnosed him with 2 separate issues, some concerns for ligament instability and a little bit of plantar flexion and had discussed polyethylene up size to provide stability, but in addition on just general examination, he  has had significant tenderness to palpation over the proximal medial tibia, consistent with a painful saphenous nerve branches and scarred down to this anterior aspect of the knee.  He had failed attempts at diagnostic evaluation with Marcaine with persistent recurring symptoms.  Once cleared through worker's comp as well as through his back surgeon to proceed with the knee, we elected to proceed in this fashion.  The two above noted procedures were discussed and reviewed with the risks of infection, DVT, persistent pain, need for future surgery, all discussed, reviewed.  Consent was obtained.  PROCEDURE IN DETAIL:  The patient was brought to the operative theater. Once adequate anesthesia, preoperative antibiotics, 3 g of Ancef, 1 g of tranexamic acid, and Decadron administered.  He was positioned supine with the left thigh tourniquet placed.  The left lower extremity was then prepped and draped in sterile fashion.  Time-out was performed, identifying the patient, planned procedure, and extremity.  His old scar was identified and demarcated on the skin.  The skin incision was made, excising the old scar.  Soft tissue planes were created.  The first portion of this case was directed to the proximal medial tibia.  Following his soft tissue exposure from vastus medialis down to the  tibial tubercle, we identified some subcutaneous fat in this area. Down to the capsule and in the subcutaneous fat, I have dissected all the way in this region to try to remove any potential saphenous nerve branches that could be adhered down to this area.  I excised this down to the midline without direct observation of his saphenous nerve without any tension of reaching that area.  Once I was satisfied with this  overall excision of the soft tissue including the nerve branches, we proceeded with arthrotomy.  Median arthrotomy was made, encountering clear synovial fluid.  I then excised significant  intraarticular scar, recreating the medial and suprapatellar, then the lateral aspect the joint around the patella, scar was debrided.  With the knee now exposed and retractors placed, I removed the old polyethylene insert and re-trialed with a 12.5 mm insert.  With this insert, I found the knee came to full extension and with the patella tracking normally through the trochlea that the knee was stable in flexion as well.  Given these findings, I irrigated the knee, injected the synovial capsule junction of the knee with 0.25% Marcaine with epinephrine, 1 mL of Toradol, and 30 mL of normal saline for total 61 mL.  The final 12.5 insert to match the size 5 femur was then inserted into the knee and the knee reduced.  The knee was irrigated with normal saline solution throughout the case and again at this point.  The extensor mechanism was then reapproximated with the knee in flexion using #1 Vicryl and 0 V-Loc suture.  The remaining of the wound was closed with 2-0 Vicryl and running 3-0 Monocryl.  The knee was cleaned, dried, and dressed sterilely using surgical glue and Aquacel dressing.  Knee was wrapped in Ace wrap.  The patient was brought to the recovery room in stable condition, tolerating the procedure well.  Findings reviewed with the family.     Madlyn Frankel Charlann Boxer, M.D.     MDO/MEDQ  D:  10/25/2014  T:  10/26/2014  Job:  [id number]

## 2014-10-26 NOTE — Discharge Summary (Signed)
Physician Discharge Summary  Patient ID: Frank Hensley MRN: [id number] DOB/AGE: 1966/02/03 49 y.o.  Admit date: 10/25/2014 Discharge date: 10/26/2014   Procedures:  Procedure(s) (LRB): OPEN SAPHENOUS NEURECTOMY OPEN SCAR DEBRIDEMENT AND POLY EXCHANGE (N/A)  Attending Physician:  Dr. Durene Romans   Admission Diagnoses:   Painful left total knee arthroplasty   Discharge Diagnoses:  Principal Problem:   S/P left TK revision Active Problems:   S/P knee replacement  Past Medical History  Diagnosis Date  . Diabetes mellitus without complication   . GERD (gastroesophageal reflux disease)   . Anginal pain     hx. of; heart attaack was ruled out (20 yrs. ago)  . Hypertension   . Sleep apnea   . Arthritis     HPI:    Pt is a 49 y.o. male complaining of left knee pain for 4 years. Pain had continually increased since the beginning. X-rays in the clinic show previous TKA of the left knee. Pt has tried various conservative treatments which have failed to alleviate their symptoms, his symptoms include pain, popping and swelling. Patient complains of pain in the left knee all the time and it even wakes him up at night, however the worse pain is with weight bearing and ROM. Various options are discussed with the patient. Risks, benefits and expectations were discussed with the patient. Patient understand the risks, benefits and expectations and wishes to proceed with surgery.   PCP: Scherrie November, NP   Discharged Condition: good  Hospital Course:  Patient underwent the above stated procedure on 10/25/2014. Patient tolerated the procedure well and brought to the recovery room in good condition and subsequently to the floor.  POD #1 BP: 117/60 ; Pulse: 87 ; Temp: 97.9 F (36.6 C) ; Resp: 16 Patient reports pain as mild. Already been up walking perhaps to recognize difference already. Dorsiflexion/plantar flexion intact, incision: dressing C/D/I, no cellulitis present and compartment  soft.   LABS  Basename    HGB  12.1  HCT  35.1    Discharge Exam: General appearance: alert, cooperative and no distress Extremities: Homans sign is negative, no sign of DVT, no edema, redness or tenderness in the calves or thighs and no ulcers, gangrene or trophic changes  Disposition: Home with follow up in 2 weeks   Follow-up Information    Follow up with [name], MD. Schedule an appointment as soon as possible for a visit in 2 weeks.   Specialty:  Orthopedic Surgery   Contact information:   [address] [place] [address] [phone]       Discharge Instructions    Call MD / Call 911    Complete by:  As directed   If you experience chest pain or shortness of breath, CALL 911 and be transported to the hospital emergency room.  If you develope a fever above 101 F, pus (white drainage) or increased drainage or redness at the wound, or calf pain, call your surgeon's office.     Change dressing    Complete by:  As directed   Maintain surgical dressing until follow up in the clinic. If the edges start to pull up, may reinforce with tape. If the dressing is no longer working, may remove and cover with gauze and tape, but must keep the area dry and clean.  Call with any questions or concerns.     Constipation Prevention    Complete by:  As directed   Drink plenty of fluids.  Prune juice may be helpful.  You may use a stool softener, such as Colace (over the counter) 100 mg twice a day.  Use MiraLax (over the counter) for constipation as needed.     Diet - low sodium heart healthy    Complete by:  As directed      Discharge instructions    Complete by:  As directed   Maintain surgical dressing until follow up in the clinic. If the edges start to pull up, may reinforce with tape. If the dressing is no longer working, may remove and cover with gauze and tape, but must keep the area dry and clean.  Follow up in 2 weeks at [facility]. Call with  any questions or concerns.     Increase activity slowly as tolerated    Complete by:  As directed   Weight bearing as tolerated with assist device (walker, cane, etc) as directed, use it as long as suggested by your surgeon or therapist, typically at least 4-6 weeks.     TED hose    Complete by:  As directed   Use stockings (TED hose) for 2 weeks on both leg(s).  You may remove them at night for sleeping.             Medication List    STOP taking these medications        ibuprofen 800 MG tablet  Commonly known as:  ADVIL,MOTRIN     oxyCODONE-acetaminophen 5-325 MG per tablet  Commonly known as:  PERCOCET/ROXICET     oxyCODONE-acetaminophen 7.5-325 MG per tablet  Commonly known as:  PERCOCET      TAKE these medications        acetaminophen 325 MG tablet  Commonly known as:  TYLENOL  Take 2 tablets (650 mg total) by mouth every 6 (six) hours as needed.     aspirin 325 MG EC tablet  Take 1 tablet (325 mg total) by mouth 2 (two) times daily.     atorvastatin 20 MG tablet  Commonly known as:  LIPITOR  Take 20 mg by mouth daily.     docusate sodium 100 MG capsule  Commonly known as:  COLACE  Take 1 capsule (100 mg total) by mouth 2 (two) times daily.     ferrous sulfate 325 (65 FE) MG tablet  Take 1 tablet (325 mg total) by mouth 3 (three) times daily after meals.     gabapentin 300 MG capsule  Commonly known as:  NEURONTIN  Take 900-1,200 mg by mouth 3 (three) times daily.     lisinopril 20 MG tablet  Commonly known as:  PRINIVIL,ZESTRIL  Take 20 mg by mouth every morning.     metFORMIN 1000 MG tablet  Commonly known as:  GLUCOPHAGE  Take 1,000 mg by mouth 2 (two) times daily with a meal.     methocarbamol 500 MG tablet  Commonly known as:  ROBAXIN  Take 1 tablet (500 mg total) by mouth every 6 (six) hours as needed for muscle spasms.     oxyCODONE 5 MG immediate release tablet  Commonly known as:  Oxy IR/ROXICODONE  Take 1-3 tablets (5-15 mg total) by  mouth every 4 (four) hours as needed for severe pain.     polyethylene glycol packet  Commonly known as:  MIRALAX / GLYCOLAX  Take 17 g by mouth 2 (two) times daily.         Signed: Anastasio Auerbach. Kaynen Minner   PA-C  10/26/2014, 12:59 PM

## 2014-10-26 NOTE — Progress Notes (Signed)
Patient ID: Frank Hensley, male   DOB: 12/04/1965, 49 y.o.   MRN: [id number] Subjective: 1 Day Post-Op Procedure(s) (LRB): OPEN SAPHENOUS NEURECTOMY OPEN SCAR DEBRIDEMENT AND POLY EXCHANGE (N/A)    Patient reports pain as mild.  Already been up walking perhaps to recognize difference already.  Objective:   VITALS:   Filed Vitals:   10/26/14 0546  BP: 117/60  Pulse: 87  Temp: 97.9 F (36.6 C)  Resp: 16    Neurovascular intact Incision: dressing C/D/I  LABS  Recent Labs  10/26/14 0418  HGB 12.1*  HCT 35.1*  WBC 11.5*  PLT 144*     Recent Labs  10/26/14 0418  NA 137  K 4.5  BUN 16  CREATININE 0.99  GLUCOSE 213*    No results for input(s): LABPT, INR in the last 72 hours.   Assessment/Plan: 1 Day Post-Op Procedure(s) (LRB): OPEN SAPHENOUS NEURECTOMY OPEN SCAR DEBRIDEMENT AND POLY EXCHANGE (N/A)   Advance diet Up with therapy Discharge home with home health versus direct outpatient if in home can't be arranged - not to hold him up  RTC in 2 weeks, reviewed goals expectations

## 2014-10-26 NOTE — Evaluation (Signed)
Physical Therapy Evaluation Patient Details Name: Frank Hensley MRN: [id number] DOB: 03-May-1965 Today's Date: 10/26/2014   History of Present Illness  49 yo male s/p L TK revision 10/25/14. Hx of DM  Clinical Impression  On eval, pt was supervision level for mobility-walked ~200 feet without an assistive device. Pain rated 5-6/10. Discussed use of assistive device, at least for pain control-pt politely declined. Issued HEP for pt to perform until he starts OP PT. Ready to d/c from PT standpoint.     Follow Up Recommendations Outpatient PT    Equipment Recommendations  None recommended by PT    Recommendations for Other Services       Precautions / Restrictions Precautions Precautions: Knee Restrictions Weight Bearing Restrictions: No LLE Weight Bearing: Weight bearing as tolerated      Mobility  Bed Mobility Overal bed mobility: Modified Independent                Transfers Overall transfer level: Modified independent                  Ambulation/Gait Ambulation/Gait assistance: Supervision Ambulation Distance (Feet): 200 Feet Assistive device: None Gait Pattern/deviations: Step-to pattern;Antalgic     General Gait Details: discussed use of AD (cane vs RW)-pt politely declined both- "To be honest, I won't use them if you give them to me". Steady, no LOB. Pain 5-6/10.   Stairs Stairs: Yes Stairs assistance: Supervision Stair Management: One rail Right;Step to pattern;Forwards Number of Stairs: 4 General stair comments: VCS safety, technique.   Wheelchair Mobility    Modified Rankin (Stroke Patients Only)       Balance                                             Pertinent Vitals/Pain Pain Assessment: 0-10 Pain Score: 4  Pain Location: L knee Pain Descriptors / Indicators: Sore Pain Intervention(s): Monitored during session    Home Living Family/patient expects to be discharged to:: Private residence Living  Arrangements: Spouse/significant other   Type of Home: House Home Access: Stairs to enter   Technical brewer of Steps: 1 Home Layout: Two level Home Equipment: None      Prior Function Level of Independence: Independent               Hand Dominance        Extremity/Trunk Assessment   Upper Extremity Assessment: Defer to OT evaluation           Lower Extremity Assessment: RLE deficits/detail RLE Deficits / Details: at least: hip flex 3/5, knee ext 3/5, moves ankle well    Cervical / Trunk Assessment: Normal  Communication   Communication: No difficulties  Cognition Arousal/Alertness: Awake/alert Behavior During Therapy: WFL for tasks assessed/performed Overall Cognitive Status: Within Functional Limits for tasks assessed                      General Comments      Exercises Total Joint Exercises Straight Leg Raises: AROM;Left;10 reps;Supine Long Arc Quad: AROM;Left;10 reps;Seated Knee Flexion: Seated;AROM;Left;10 reps Goniometric ROM: ~0-125 degrees      Assessment/Plan    PT Assessment All further PT needs can be met in the next venue of care (OP PT)  PT Diagnosis     PT Problem List Decreased strength;Decreased range of motion;Decreased mobility;Pain  PT Treatment Interventions  PT Goals (Current goals can be found in the Care Plan section) Acute Rehab PT Goals Patient Stated Goal: regain independence. less pain. PT Goal Formulation: All assessment and education complete, DC therapy    Frequency     Barriers to discharge        Co-evaluation               End of Session   Activity Tolerance: Patient tolerated treatment well Patient left: in bed;with call bell/phone within reach;with family/visitor present           Time: 4492-5241 PT Time Calculation (min) (ACUTE ONLY): 20 min   Charges:   PT Evaluation $Initial PT Evaluation Tier I: 1 Procedure     PT G Codes:        Weston Anna, MPT Pager:  [phone]

## 2014-11-09 ENCOUNTER — Ambulatory Visit (HOSPITAL_COMMUNITY)
Admission: RE | Admit: 2014-11-09 | Discharge: 2014-11-09 | Disposition: A | Discharge status: Home / Self Care | Payer: Worker's Compensation | Source: Ambulatory Visit | Attending: Cardiovascular Disease | Admitting: Cardiovascular Disease

## 2014-11-09 ENCOUNTER — Other Ambulatory Visit (HOSPITAL_COMMUNITY): Payer: Self-pay | Admitting: Orthopedic Surgery

## 2014-11-09 DIAGNOSIS — E119 Type 2 diabetes mellitus without complications: Secondary | ICD-10-CM | POA: Insufficient documentation

## 2014-11-09 DIAGNOSIS — M7989 Other specified soft tissue disorders: Secondary | ICD-10-CM

## 2014-11-09 DIAGNOSIS — Z7982 Long term (current) use of aspirin: Secondary | ICD-10-CM | POA: Diagnosis not present

## 2014-11-09 DIAGNOSIS — M79662 Pain in left lower leg: Secondary | ICD-10-CM

## 2014-11-09 DIAGNOSIS — Z9889 Other specified postprocedural states: Secondary | ICD-10-CM | POA: Insufficient documentation

## 2014-11-09 DIAGNOSIS — M79605 Pain in left leg: Secondary | ICD-10-CM

## 2014-11-09 DIAGNOSIS — I10 Essential (primary) hypertension: Secondary | ICD-10-CM | POA: Insufficient documentation

## 2014-11-09 DIAGNOSIS — E669 Obesity, unspecified: Secondary | ICD-10-CM | POA: Insufficient documentation

## 2015-03-19 IMAGING — CR DG CHEST 2V
2 series · 2 of 2 positions shown · non-contrast
Comparison: 07/07/2007

CLINICAL DATA: Preoperative evaluation for lumbar spine surgery,
history diabetes, hypertension, GERD

EXAM:
CHEST  2 VIEW

[w chest pa]
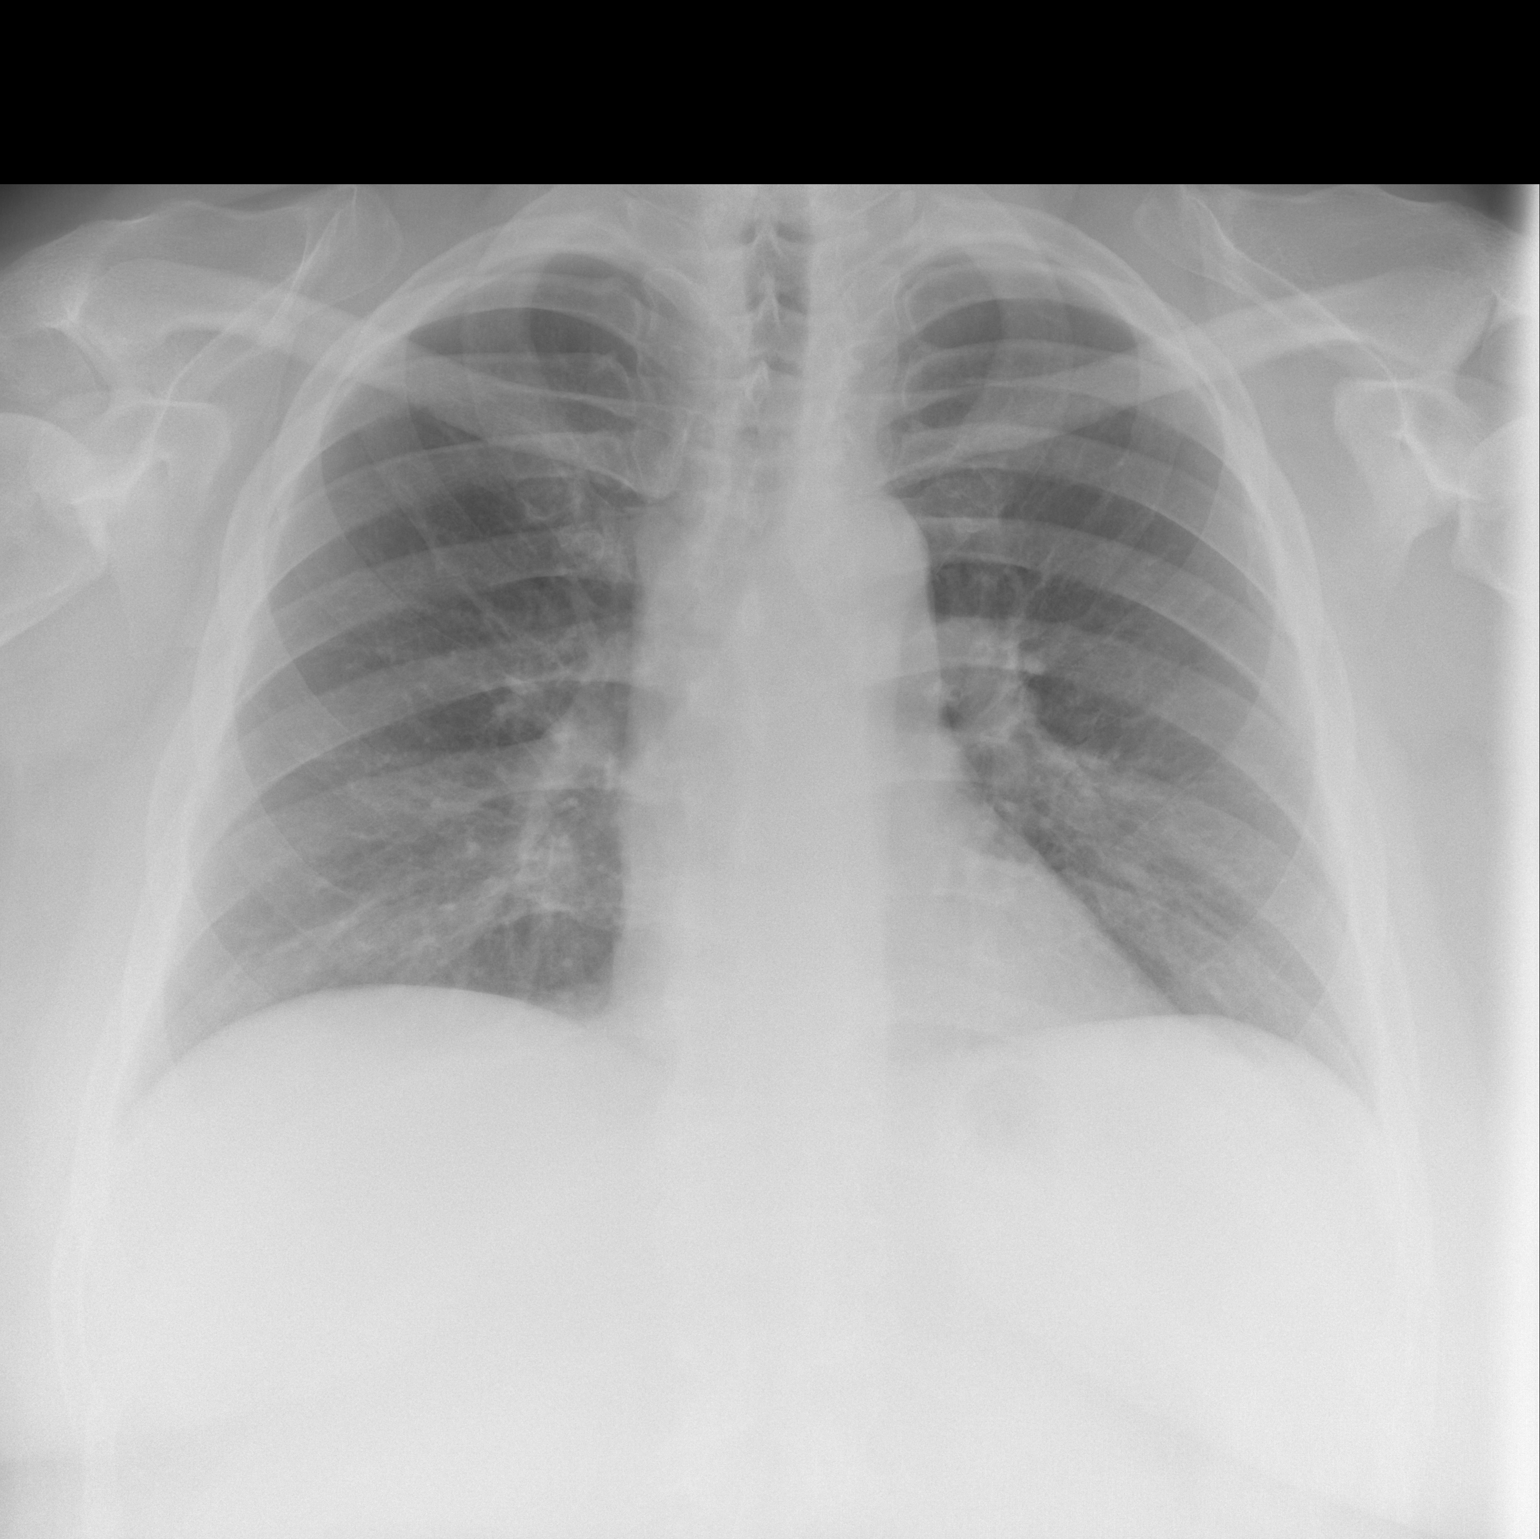

[w chest lat]
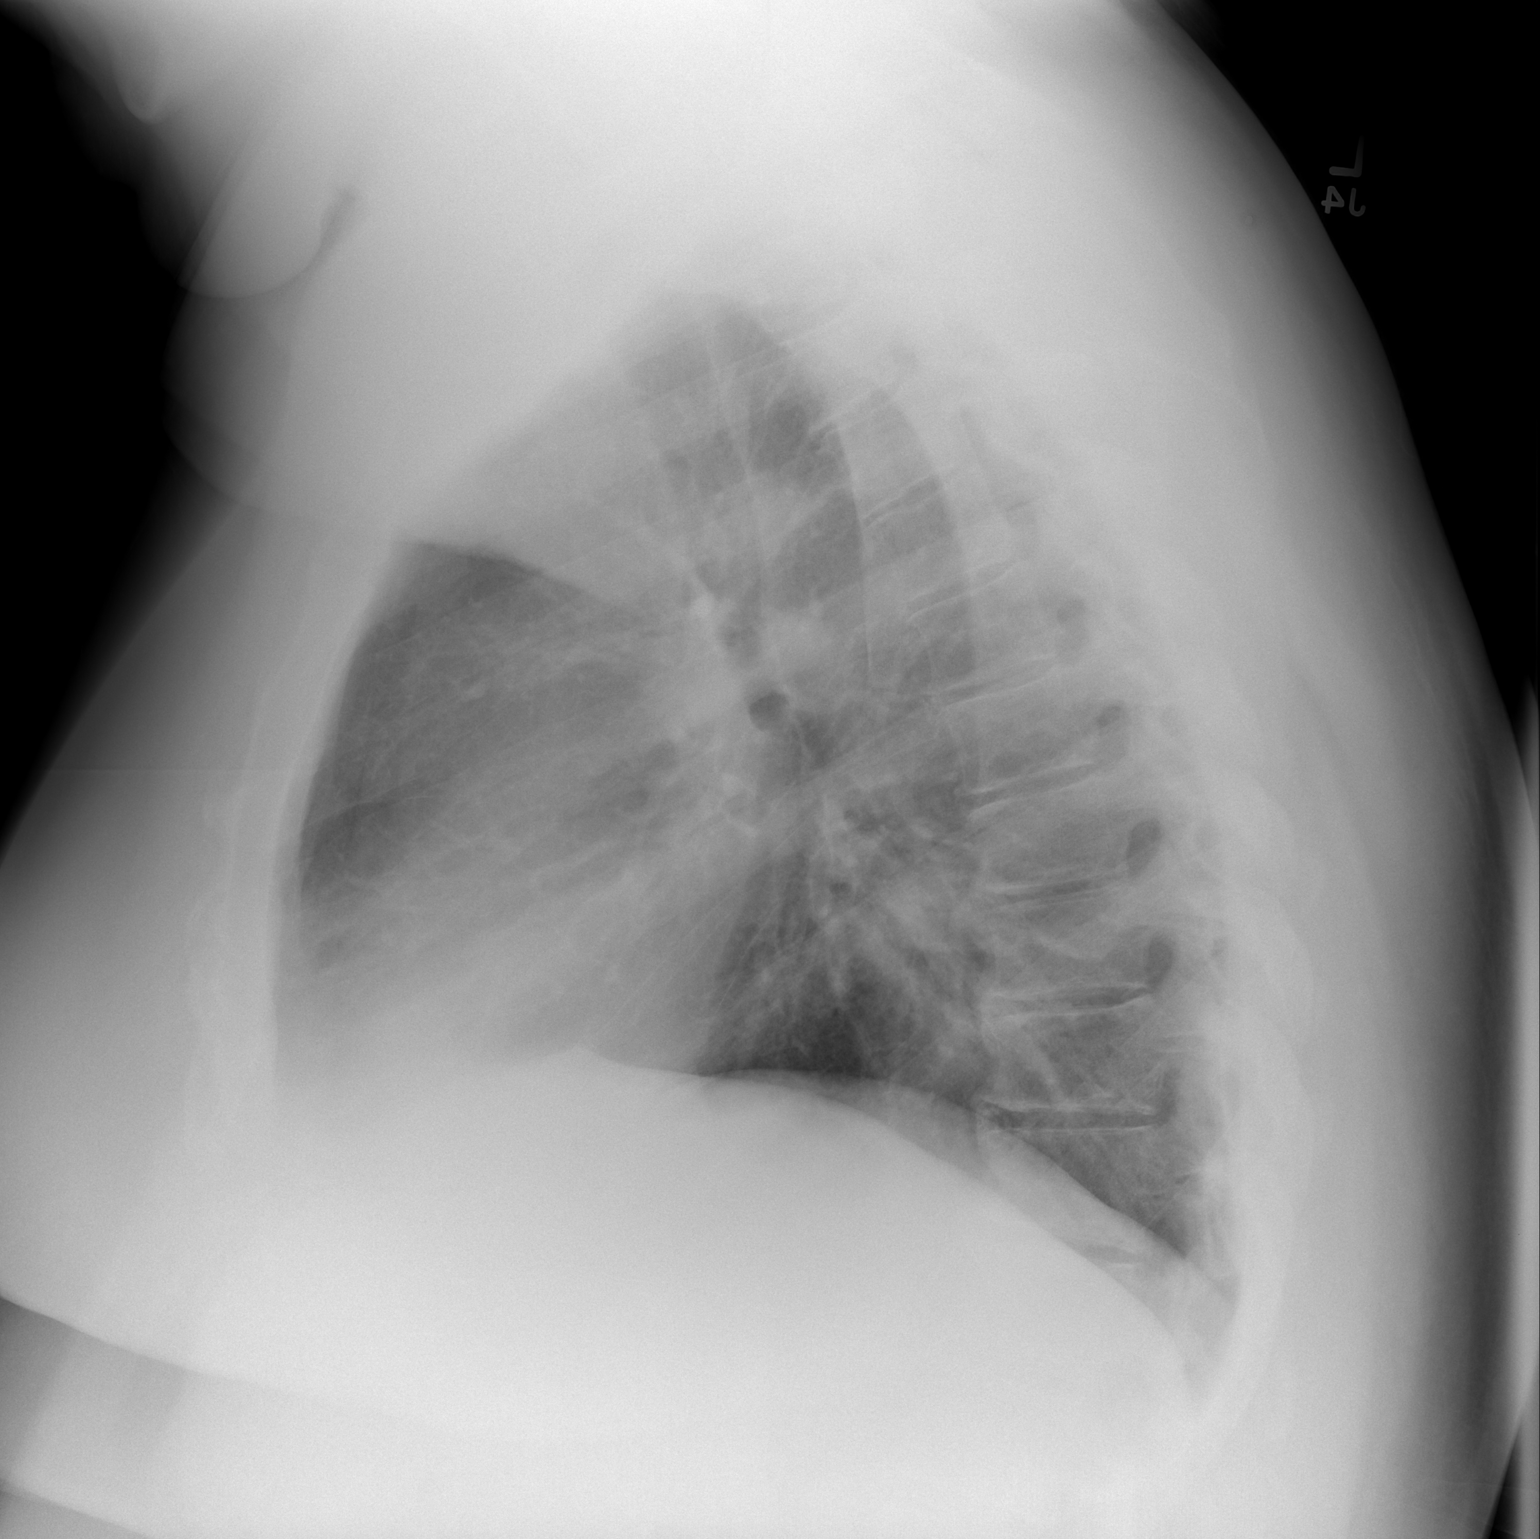

[2 of 2 positions shown; findings below may reference images not displayed]

FINDINGS: Normal heart size, mediastinal contours, and pulmonary vascularity.

Lungs clear.

No pneumothorax.

Bones unremarkable.
IMPRESSION: Normal exam.

## 2015-03-19 IMAGING — CR DG LUMBAR SPINE 2-3V
2 series · 2 of 2 positions shown · non-contrast
Comparison: 07/07/2007 lumbar spine radiographs

CLINICAL DATA: 48-year-old male -preoperative examination for
lumbar decompression.

EXAM:
LUMBAR SPINE - 2-3 VIEW

[w l-spine a.p. *]
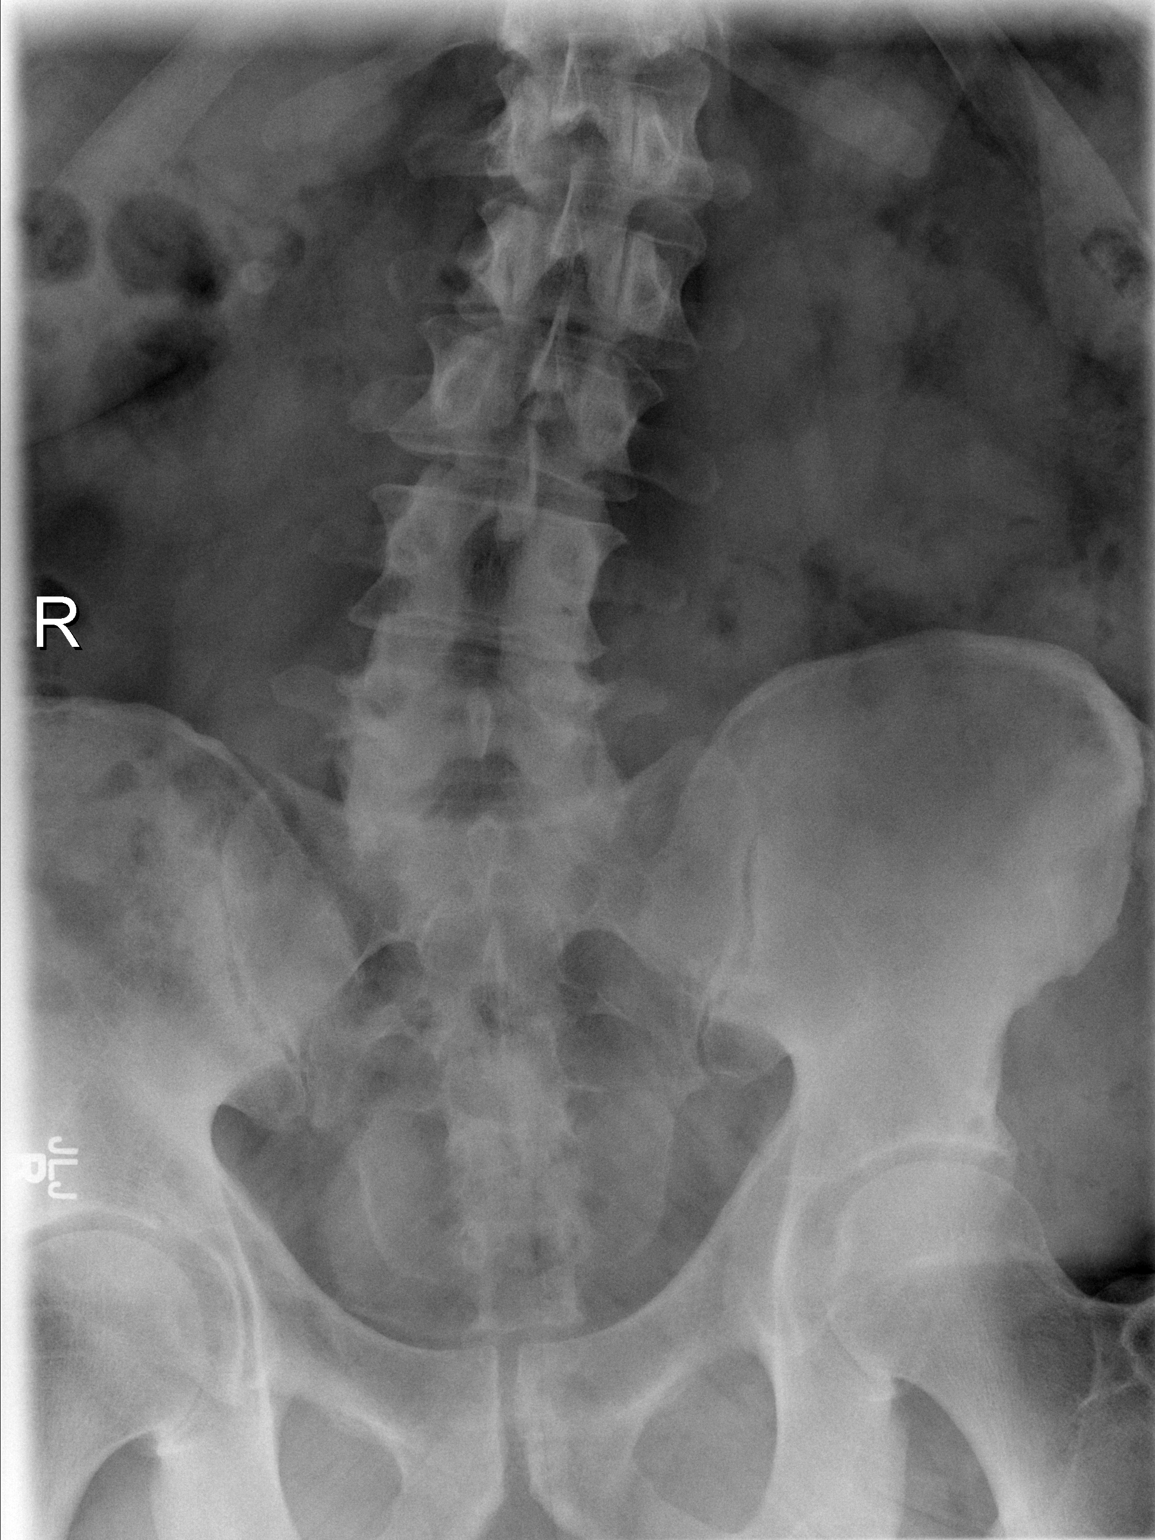

[w l-spine lat *]
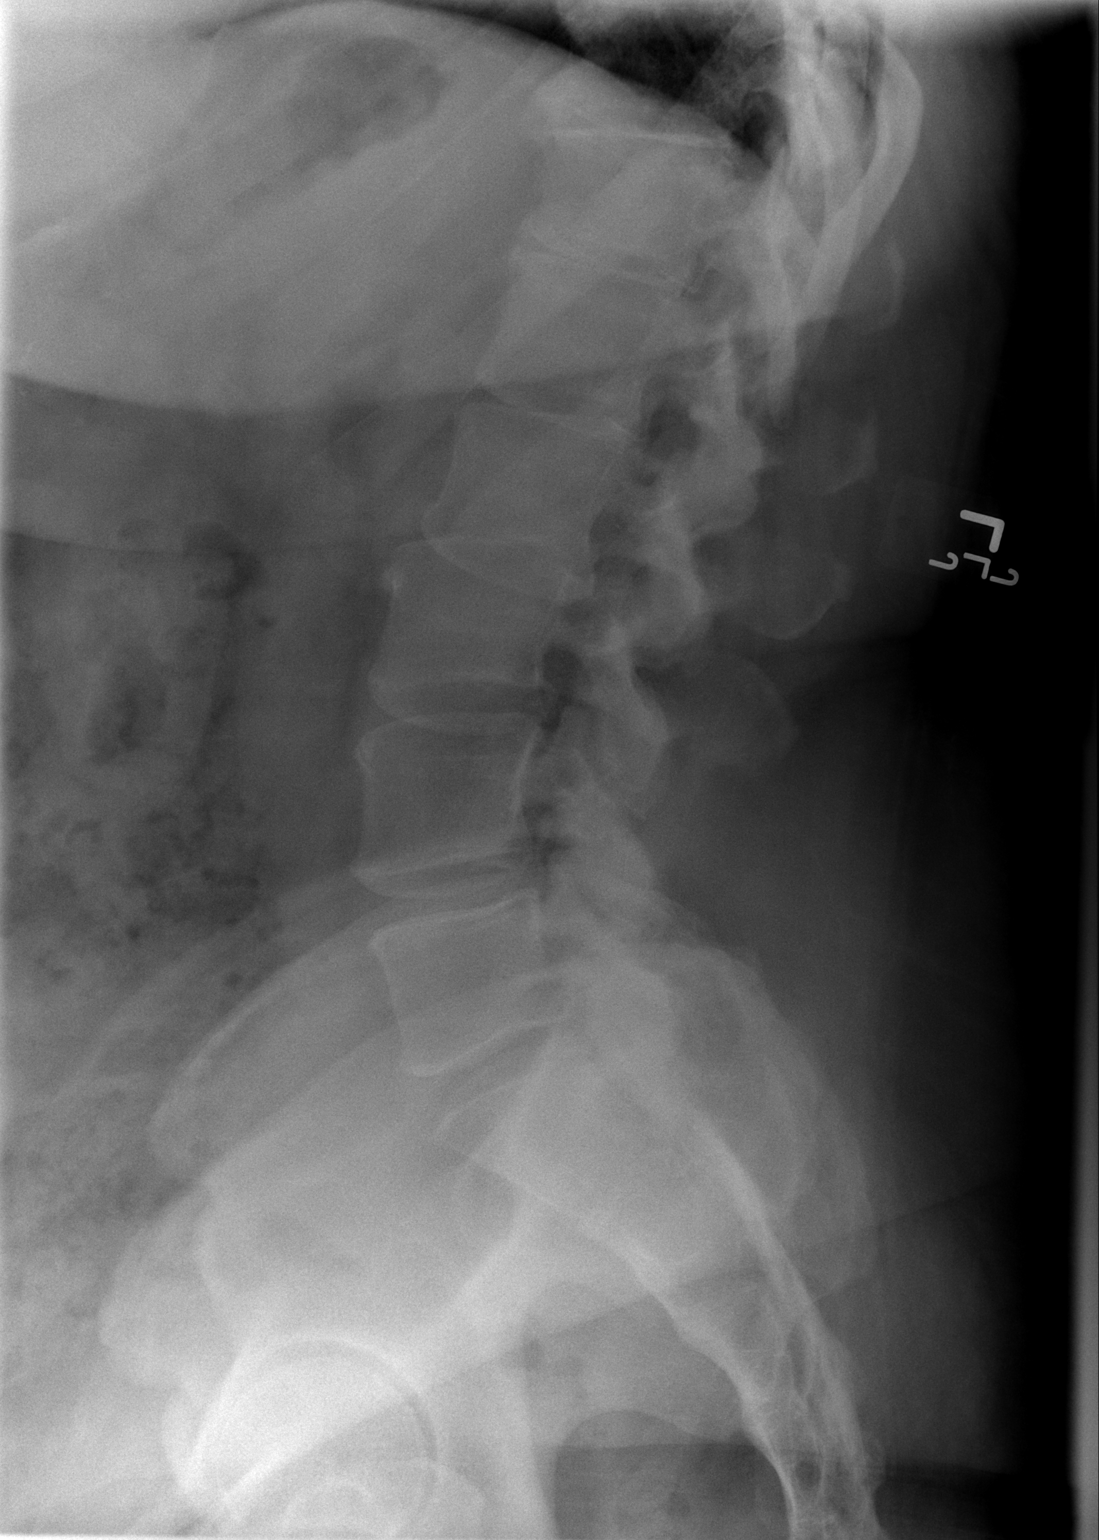

[2 of 2 positions shown; findings below may reference images not displayed]

FINDINGS: Five non rib-bearing lumbar type vertebra are again identified.

Mild apex right lower lumbar scoliosis noted.

1 cm posteriolateral subluxation at L2-3 is identified.

Laminectomy changes at L4 is present.

Mild multilevel degenerative disc disease present.

No acute bony abnormalities identified.
IMPRESSION: Five non rib-bearing lumbar type vertebra are with mild apex right
lower lumbar scoliosis.

1 cm posteriolateral subluxation at L2-3.

Mild multilevel degenerative disc disease.

## 2015-03-26 IMAGING — DX DG SPINE 1V PORT
1 series · 1 of 1 positions shown · non-contrast
Comparison: Earlier the same day

CLINICAL DATA: Intraoperative evaluation during lumbar
decompression at L2-3, L3-4 and possibly L4-5.

EXAM:
PORTABLE SPINE - 1 VIEW

[l-spine lat]
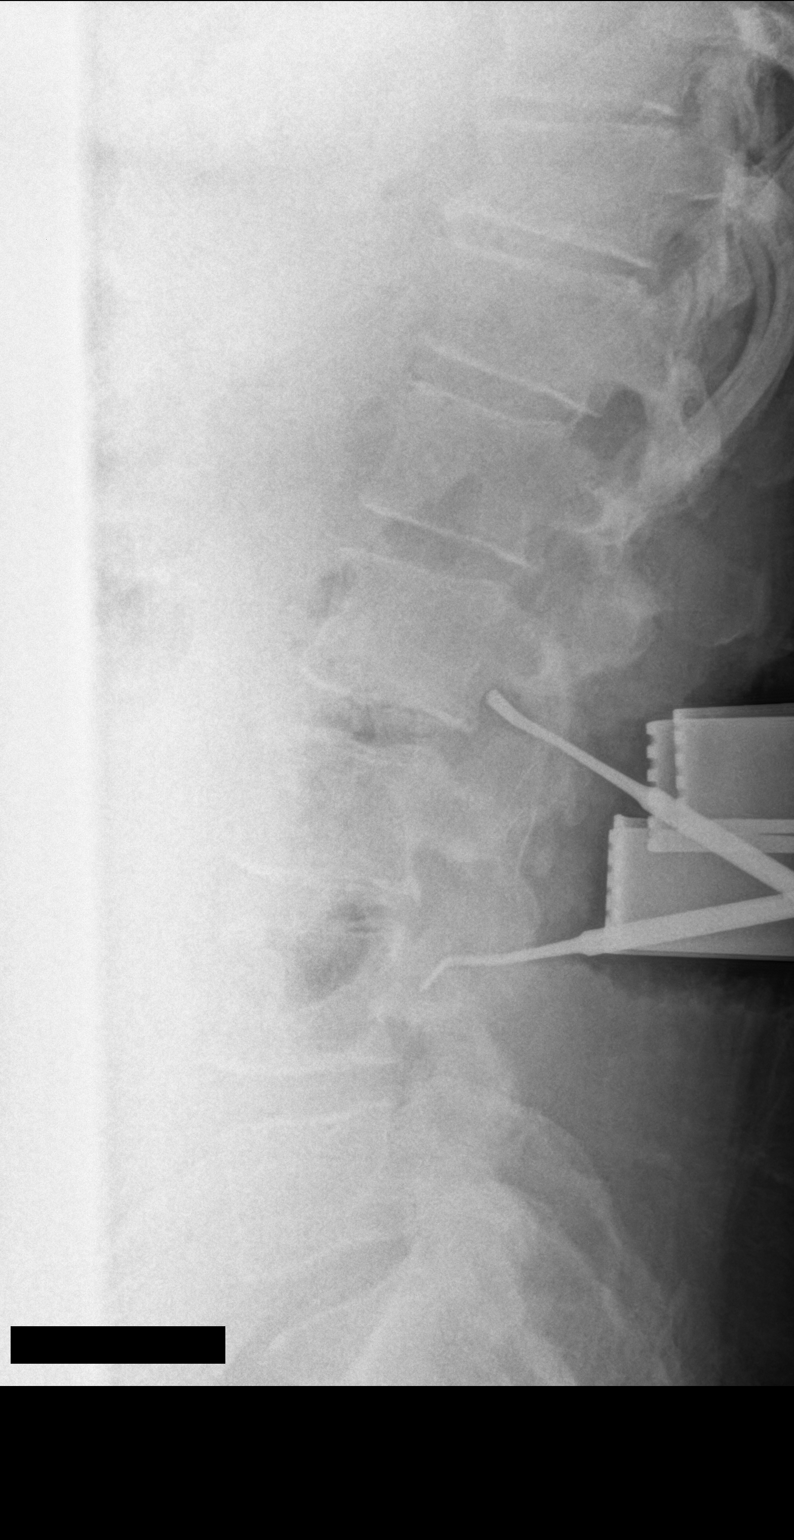

[1 of 1 positions shown; findings below may reference images not displayed]

FINDINGS: Single cross-table portable view obtained laterally the lumbar spine
at 3793 hrs is labeled #3 and shows soft tissue retractors in the
lower back. Same numbering scheme is used for this exam as was used
on the portable study immediately prior. Two surgical probes are
identified overlying the surgical bed. The tip of the more caudal of
the 2 probes overlies the L4 pedicle. The more cranial of the 2
probe tips overlies a position just caudal to the L2 pedicle.
IMPRESSION: Intraoperative localization.

## 2017-05-24 NOTE — Patient Instructions (Addendum)
DOCTOR SHEAHAN  05/24/2017   Your procedure is scheduled on: 06-03-17   Report to [facility] Main  Entrance              Report to admitting at      1120 AM    Call this number if you have problems the morning of surgery [phone]    Remember:Do not eat food  :After Midnight.YOU MAY HAVE CLEAR LIQUIDS UNTIL 0745 AM THEN NOTHING BY MOUTH     CLEAR LIQUID DIET   Foods Allowed                                                                     Foods Excluded  Coffee and tea, regular and decaf                             liquids that you cannot  Plain Jell-O in any flavor                                             see through such as: Fruit ices (not with fruit pulp)                                     milk, soups, orange juice  Iced Popsicles                                    All solid food Carbonated beverages, regular and diet                                    Cranberry, grape and apple juices Sports drinks like Gatorade Lightly seasoned clear broth or consume(fat free) Sugar, honey syrup  Sample Menu Breakfast                                Lunch                                     Supper Cranberry juice                    Beef broth                            Chicken broth Jell-O                                     Grape juice  Apple juice Coffee or tea                        Jell-O                                      Popsicle                                                Coffee or tea                        Coffee or tea  _____________________________________________________________________     Take these medicines the morning of surgery with A SIP OF WATER: GABAPENTIN, LIPITOR, AMLODIPINE, OXYCODONE IF NEEDED  DO NOT TAKE ANY DIABETIC MEDICATIONS DAY OF YOUR SURGERY                               You may not have any metal on your body including hair pins and              piercings  Do not wear jewelry,   lotions, powders or perfumes, deodorant        y.              Men may shave face and neck.   Do not bring valuables to the hospital. [facility] IS NOT             RESPONSIBLE   FOR VALUABLES.  Contacts, dentures or bridgework may not be worn into surgery.  Leave suitcase in the car. After surgery it may be brought to your room.             [place] - Preparing for Surgery Before surgery, you can play an important role.  Because skin is not sterile, your skin needs to be as free of germs as possible.  You can reduce the number of germs on your skin by washing with CHG (chlorahexidine gluconate) soap before surgery.  CHG is an antiseptic cleaner which kills germs and bonds with the skin to continue killing germs even after washing. Please DO NOT use if you have an allergy to CHG or antibacterial soaps.  If your skin becomes reddened/irritated stop using the CHG and inform your nurse when you arrive at Short Stay. Do not shave (including legs and underarms) for at least 48 hours prior to the first CHG shower.  You may shave your face/neck. Please follow these instructions carefully:  1.  Shower with CHG Soap the night before surgery and the  morning of Surgery.  2.  If you choose to wash your hair, wash your hair first as usual with your  normal  shampoo.  3.  After you shampoo, rinse your hair and body thoroughly to remove the  shampoo.                           4.  Use CHG as you would any other liquid soap.  You can apply chg directly  to the skin and wash  Gently with a scrungie or clean washcloth.  5.  Apply the CHG Soap to your body ONLY FROM THE NECK DOWN.   Do not use on face/ open                           Wound or open sores. Avoid contact with eyes, ears mouth and genitals (private parts).                       Wash face,  Genitals (private parts) with your normal soap.             6.  Wash thoroughly, paying special attention to the area where your surgery   will be performed.  7.  Thoroughly rinse your body with warm water from the neck down.  8.  DO NOT shower/wash with your normal soap after using and rinsing off  the CHG Soap.                9.  Pat yourself dry with a clean towel.            10.  Wear clean pajamas.            11.  Place clean sheets on your bed the night of your first shower and do not  sleep with pets. Day of Surgery : Do not apply any lotions/deodorants the morning of surgery.  Please wear clean clothes to the hospital/surgery center.  FAILURE TO FOLLOW THESE INSTRUCTIONS MAY RESULT IN THE CANCELLATION OF YOUR SURGERY PATIENT SIGNATURE_________________________________  NURSE SIGNATURE__________________________________  ________________________________________________________________________  WHAT IS A BLOOD TRANSFUSION? Blood Transfusion Information  A transfusion is the replacement of blood or some of its parts. Blood is made up of multiple cells which provide different functions.  Red blood cells carry oxygen and are used for blood loss replacement.  White blood cells fight against infection.  Platelets control bleeding.  Plasma helps clot blood.  Other blood products are available for specialized needs, such as hemophilia or other clotting disorders. BEFORE THE TRANSFUSION  Who gives blood for transfusions?   Healthy volunteers who are fully evaluated to make sure their blood is safe. This is blood bank blood. Transfusion therapy is the safest it has ever been in the practice of medicine. Before blood is taken from a donor, a complete history is taken to make sure that person has no history of diseases nor engages in risky social behavior (examples are intravenous drug use or sexual activity with multiple partners). The donor's travel history is screened to minimize risk of transmitting infections, such as malaria. The donated blood is tested for signs of infectious diseases, such as HIV and hepatitis. The  blood is then tested to be sure it is compatible with you in order to minimize the chance of a transfusion reaction. If you or a relative donates blood, this is often done in anticipation of surgery and is not appropriate for emergency situations. It takes many days to process the donated blood. RISKS AND COMPLICATIONS Although transfusion therapy is very safe and saves many lives, the main dangers of transfusion include:   Getting an infectious disease.  Developing a transfusion reaction. This is an allergic reaction to something in the blood you were given. Every precaution is taken to prevent this. The decision to have a blood transfusion has been considered carefully by your caregiver before blood is given. Blood is not given unless the benefits  outweigh the risks. AFTER THE TRANSFUSION  Right after receiving a blood transfusion, you will usually feel much better and more energetic. This is especially true if your red blood cells have gotten low (anemic). The transfusion raises the level of the red blood cells which carry oxygen, and this usually causes an energy increase.  The nurse administering the transfusion will monitor you carefully for complications. HOME CARE INSTRUCTIONS  No special instructions are needed after a transfusion. You may find your energy is better. Speak with your caregiver about any limitations on activity for underlying diseases you may have. SEEK MEDICAL CARE IF:   Your condition is not improving after your transfusion.  You develop redness or irritation at the intravenous (IV) site. SEEK IMMEDIATE MEDICAL CARE IF:  Any of the following symptoms occur over the next 12 hours:  Shaking chills.  You have a temperature by mouth above 102 F (38.9 C), not controlled by medicine.  Chest, back, or muscle pain.  People around you feel you are not acting correctly or are confused.  Shortness of breath or difficulty breathing.  Dizziness and fainting.  You get  a rash or develop hives.  You have a decrease in urine output.  Your urine turns a dark color or changes to pink, red, or brown. Any of the following symptoms occur over the next 10 days:  You have a temperature by mouth above 102 F (38.9 C), not controlled by medicine.  Shortness of breath.  Weakness after normal activity.  The white part of the eye turns yellow (jaundice).  You have a decrease in the amount of urine or are urinating less often.  Your urine turns a dark color or changes to pink, red, or brown. Document Released: 03/09/2000 Document Revised: 06/04/2011 Document Reviewed: 10/27/2007 [employer] Patient Information 2014 [place], [place].  _______________________________________________________________________  Incentive Spirometer  An incentive spirometer is a tool that can help keep your lungs clear and active. This tool measures how well you are filling your lungs with each breath. Taking long deep breaths may help reverse or decrease the chance of developing breathing (pulmonary) problems (especially infection) following:  A long period of time when you are unable to move or be active. BEFORE THE PROCEDURE   If the spirometer includes an indicator to show your best effort, your nurse or respiratory therapist will set it to a desired goal.  If possible, sit up straight or lean slightly forward. Try not to slouch.  Hold the incentive spirometer in an upright position. INSTRUCTIONS FOR USE  1. Sit on the edge of your bed if possible, or sit up as far as you can in bed or on a chair. 2. Hold the incentive spirometer in an upright position. 3. Breathe out normally. 4. Place the mouthpiece in your mouth and seal your lips tightly around it. 5. Breathe in slowly and as deeply as possible, raising the piston or the ball toward the top of the column. 6. Hold your breath for 3-5 seconds or for as long as possible. Allow the piston or ball to fall to the bottom of the  column. 7. Remove the mouthpiece from your mouth and breathe out normally. 8. Rest for a few seconds and repeat Steps 1 through 7 at least 10 times every 1-2 hours when you are awake. Take your time and take a few normal breaths between deep breaths. 9. The spirometer may include an indicator to show your best effort. Use the indicator as a goal to  work toward during each repetition. 10. After each set of 10 deep breaths, practice coughing to be sure your lungs are clear. If you have an incision (the cut made at the time of surgery), support your incision when coughing by placing a pillow or rolled up towels firmly against it. Once you are able to get out of bed, walk around indoors and cough well. You may stop using the incentive spirometer when instructed by your caregiver.  RISKS AND COMPLICATIONS  Take your time so you do not get dizzy or light-headed.  If you are in pain, you may need to take or ask for pain medication before doing incentive spirometry. It is harder to take a deep breath if you are having pain. AFTER USE  Rest and breathe slowly and easily.  It can be helpful to keep track of a log of your progress. Your caregiver can provide you with a simple table to help with this. If you are using the spirometer at home, follow these instructions: SEEK MEDICAL CARE IF:   You are having difficultly using the spirometer.  You have trouble using the spirometer as often as instructed.  Your pain medication is not giving enough relief while using the spirometer.  You develop fever of 100.5 F (38.1 C) or higher. SEEK IMMEDIATE MEDICAL CARE IF:   You cough up bloody sputum that had not been present before.  You develop fever of 102 F (38.9 C) or greater.  You develop worsening pain at or near the incision site. MAKE SURE YOU:   Understand these instructions.  Will watch your condition.  Will get help right away if you are not doing well or get worse. Document Released:  07/23/2006 Document Revised: 06/04/2011 Document Reviewed: 09/23/2006 [facility] Patient Information 2014 [place], [place].   ________________________________________________________________________

## 2017-05-27 ENCOUNTER — Other Ambulatory Visit: Payer: Self-pay

## 2017-05-27 ENCOUNTER — Encounter (HOSPITAL_COMMUNITY)
Admission: RE | Admit: 2017-05-27 | Discharge: 2017-05-27 | Disposition: A | Discharge status: Home / Self Care | Payer: Worker's Compensation | Source: Ambulatory Visit | Attending: Orthopedic Surgery | Admitting: Orthopedic Surgery

## 2017-05-27 ENCOUNTER — Encounter (HOSPITAL_COMMUNITY): Payer: Self-pay

## 2017-05-27 DIAGNOSIS — M1611 Unilateral primary osteoarthritis, right hip: Secondary | ICD-10-CM | POA: Diagnosis not present

## 2017-05-27 DIAGNOSIS — Z01818 Encounter for other preprocedural examination: Secondary | ICD-10-CM | POA: Insufficient documentation

## 2017-05-27 LAB — BASIC METABOLIC PANEL
ANION GAP: 8 (ref 5–15)
BUN: 13 mg/dL (ref 6–20)
CALCIUM: 9.1 mg/dL (ref 8.9–10.3)
CO2: 23 mmol/L (ref 22–32)
Chloride: 110 mmol/L (ref 101–111)
Creatinine, Ser: 1.13 mg/dL (ref 0.61–1.24)
GLUCOSE: 81 mg/dL (ref 65–99)
POTASSIUM: 4 mmol/L (ref 3.5–5.1)
Sodium: 141 mmol/L (ref 135–145)

## 2017-05-27 LAB — SURGICAL PCR SCREEN
MRSA, PCR: NEGATIVE
STAPHYLOCOCCUS AUREUS: POSITIVE — AB

## 2017-05-27 LAB — CBC
HEMATOCRIT: 39 % (ref 39.0–52.0)
HEMOGLOBIN: 13.3 g/dL (ref 13.0–17.0)
MCH: 27.9 pg (ref 26.0–34.0)
MCHC: 34.1 g/dL (ref 30.0–36.0)
MCV: 81.9 fL (ref 78.0–100.0)
Platelets: 182 10*3/uL (ref 150–400)
RBC: 4.76 MIL/uL (ref 4.22–5.81)
RDW: 13.6 % (ref 11.5–15.5)
WBC: 6 10*3/uL (ref 4.0–10.5)

## 2017-05-27 NOTE — Progress Notes (Addendum)
Clearance Dr. Bonnee Quinaniel Bryant and LOV note 05-20-17 on chart  05-06-17 [id number] 7.2 epic care everywhere  ekg 2-27 -19 epic care everywhere . Requested tracing for chart.

## 2017-05-27 NOTE — Progress Notes (Signed)
bARI BED REQUESTED

## 2017-06-02 MED ORDER — DEXTROSE 5 % IV SOLN
3.0000 g | INTRAVENOUS | Status: AC
  Administered 2017-06-03: 3 g via INTRAVENOUS
  Filled 2017-06-02: qty 3

## 2017-06-02 NOTE — H&P (Signed)
TOTAL KNEE ADMISSION H&P  Patient is being admitted for right total knee arthroplasty and possible removal of retained hardware.  Subjective:  Chief Complaint:Right knee post-traumatic OA / pain with painful retained hardware  HPI: Frank Hensley, 52 y.o. male, has a history of pain and functional disability in the right knee due to trauma and arthritis and has failed non-surgical conservative treatments for greater than 12 weeks to include NSAID's and/or analgesics, corticosteriod injections and activity modification.  Onset of symptoms was abrupt, starting  years ago with gradually worsening course since that time. The patient noted prior procedures on the knee to include  arthroscopy and ORIF of torn ligament on the right knee(s).  Patient currently rates pain in the bilaterally knee(s) at 8 out of 10 with activity. Patient has night pain, worsening of pain with activity and weight bearing, pain that interferes with activities of daily living, pain with passive range of motion, crepitus and joint swelling.  Patient has evidence of periarticular osteophytes and joint space narrowing by imaging studies.  There is no active infection.  Risks, benefits and expectations were discussed with the patient.  Risks including but not limited to the risk of anesthesia, blood clots, nerve damage, blood vessel damage, failure of the prosthesis, infection and up to and including death.  Patient understand the risks, benefits and expectations and wishes to proceed with surgery.   PCP: Etta Quill, PA-C  D/C Plans:       Home  Post-op Meds:       No Rx given   Tranexamic Acid:      To be given - IV   Decadron:      Is to be given  FYI:      ASA  Oxycodone  DME:    Rx given for - RW   PT:   OPPT rx given   Patient Active Problem List   Diagnosis Date Noted  . 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 S/P knee replacement 10/25/2014  . Spinal stenosis of lumbar region 12/30/2013  . Spinal stenosis,  multilevel 12/30/2013   Past Medical History:  Diagnosis Date  . Anginal pain (HCC)    hx. of; heart attaack was ruled out (20 yrs. ago)  . Arthritis   . Diabetes mellitus without complication (HCC)    type 2  . GERD (gastroesophageal reflux disease)    food related  . Hypertension   . Sleep apnea    mild no cpap    Past Surgical History:  Procedure Laterality Date  . APPENDECTOMY    . BACK SURGERY     x2  . I&D KNEE WITH POLY EXCHANGE N/A 10/25/2014   Procedure: OPEN SAPHENOUS NEURECTOMY OPEN SCAR DEBRIDEMENT AND POLY EXCHANGE;  Surgeon: Durene Romans, MD;  Location: [facility];  Service: Orthopedics;  Laterality: N/A;  . JOINT REPLACEMENT     left knee  . left knee arthroscopy     x 2   . LUMBAR LAMINECTOMY/DECOMPRESSION MICRODISCECTOMY Left 12/30/2013   Procedure: LUMBAR DECOMPRESSION L2-3, REDO L3-4 ;  Surgeon: Javier Docker, MD;  Location: [facility]S;  Service: Orthopedics;  Laterality: Left;  . right knee     x3 total -ligament, and cartilage x 2  . right shoulder     x2  . Right total knee     06-03-17 Dr. Charlann Boxer   . SHOULDER SURGERY     x3  . TONSILLECTOMY      Current Facility-Administered Medications  Medication Dose  Route Frequency Provider Last Rate Last Dose  . [START ON 06/03/2017] ceFAZolin (ANCEF) 3 g in dextrose 5 % 50 mL IVPB  3 g Intravenous On Call to OR Durene Romans, MD       Current Outpatient Medications  Medication Sig Dispense Refill Last Dose  . amLODipine (NORVASC) 10 MG tablet Take 5 mg by mouth daily.     Marland Kitchen aspirin EC 81 MG tablet Take 81 mg by mouth daily.     Marland Kitchen atorvastatin (LIPITOR) 20 MG tablet Take 20 mg by mouth daily.   10/24/2014 at am  . gabapentin (NEURONTIN) 300 MG capsule Take 900 mg by mouth 3 (three) times daily.    10/24/2014 at am  . glipiZIDE (GLUCOTROL XL) 5 MG 24 hr tablet Take 5 mg by mouth daily.     Marland Kitchen ibuprofen (ADVIL,MOTRIN) 600 MG tablet Take 600 mg by mouth 4 (four) times daily as needed for pain.  1   . lisinopril  (PRINIVIL,ZESTRIL) 20 MG tablet Take 20 mg by mouth daily.    10/24/2014 at am  . metFORMIN (GLUCOPHAGE) 1000 MG tablet Take 1,000 mg by mouth 2 (two) times daily with a meal.   10/24/2014 at 0800  . oxyCODONE (OXY IR/ROXICODONE) 5 MG immediate release tablet Take 1-3 tablets (5-15 mg total) by mouth every 4 (four) hours as needed for severe pain. (Patient taking differently: Take 5-10 mg by mouth 2 (two) times daily as needed for severe pain. ) 120 tablet 0    No Known Allergies   Social History   Tobacco Use  . Smoking status: Never Smoker  . Smokeless tobacco: Never Used  Substance Use Topics  . Alcohol use: No       Review of Systems  Constitutional: Negative.   HENT: Negative.   Eyes: Negative.   Respiratory: Negative.   Cardiovascular: Negative.   Gastrointestinal: Positive for heartburn.  Genitourinary: Negative.   Musculoskeletal: Positive for back pain and joint pain.  Skin: Negative.   Neurological: Negative.   Endo/Heme/Allergies: Negative.   Psychiatric/Behavioral: Negative.     Objective:  Physical Exam  Constitutional: He is oriented to person, place, and time. He appears well-developed.  HENT:  Head: Normocephalic.  Eyes: Pupils are equal, round, and reactive to light.  Neck: Neck supple. No JVD present. No tracheal deviation present. No thyromegaly present.  Cardiovascular: Normal rate, regular rhythm and intact distal pulses.  Respiratory: Effort normal and breath sounds normal. No respiratory distress. He has no wheezes.  GI: Soft. There is no tenderness. There is no guarding.  Musculoskeletal:       Right knee: He exhibits decreased range of motion, swelling and bony tenderness. He exhibits no ecchymosis, no deformity, no laceration and no erythema. Tenderness found.  Lymphadenopathy:    He has no cervical adenopathy.  Neurological: He is alert and oriented to person, place, and time. A sensory deficit (bilateral LE neuropaty) is present.  Skin: Skin is  warm and dry.  Psychiatric: He has a normal mood and affect.     Labs:  Estimated body mass index is 50.59 kg/m as calculated from the following:   Height as of 05/27/17: 6' (1.829 m).   Weight as of 05/27/17: 169.2 kg (373 lb).   Imaging Review Plain radiographs demonstrate severe degenerative joint disease of the right knee(s).  The bone quality appears to be good for age and reported activity level.  Assessment/Plan:  End stage arthritis, right knee   The patient history, physical examination,  clinical judgment of the provider and imaging studies are consistent with end stage degenerative joint disease of the right knee(s) and total knee arthroplasty is deemed medically necessary. The treatment options including medical management, injection therapy arthroscopy and arthroplasty were discussed at length. The risks and benefits of total knee arthroplasty were presented and reviewed. The risks due to aseptic loosening, infection, stiffness, patella tracking problems, thromboembolic complications and other imponderables were discussed. The patient acknowledged the explanation, agreed to proceed with the plan and consent was signed. Patient is being admitted for inpatient treatment for surgery, pain control, PT, OT, prophylactic antibiotics, VTE prophylaxis, progressive ambulation and ADL's and discharge planning. The patient is planning to be discharged home.     Anastasio AuerbachMatthew S. Lavell Ridings   PA-C  06/02/2017, 2:52 PM

## 2017-06-03 ENCOUNTER — Other Ambulatory Visit: Payer: Self-pay

## 2017-06-03 ENCOUNTER — Encounter (HOSPITAL_COMMUNITY): Payer: Self-pay | Admitting: *Deleted

## 2017-06-03 ENCOUNTER — Encounter (HOSPITAL_COMMUNITY)
Admission: RE | Disposition: A | Discharge status: Home / Self Care | Payer: Self-pay | Source: Ambulatory Visit | Attending: Orthopedic Surgery

## 2017-06-03 ENCOUNTER — Inpatient Hospital Stay (HOSPITAL_COMMUNITY): Payer: Worker's Compensation | Admitting: Anesthesiology

## 2017-06-03 ENCOUNTER — Observation Stay (HOSPITAL_COMMUNITY)
Admission: RE | Admit: 2017-06-03 | Discharge: 2017-06-04 | Disposition: A | Discharge status: Home / Self Care | Payer: Worker's Compensation | Source: Ambulatory Visit | Attending: Orthopedic Surgery | Admitting: Orthopedic Surgery

## 2017-06-03 DIAGNOSIS — Z7984 Long term (current) use of oral hypoglycemic drugs: Secondary | ICD-10-CM | POA: Insufficient documentation

## 2017-06-03 DIAGNOSIS — Z96651 Presence of right artificial knee joint: Secondary | ICD-10-CM

## 2017-06-03 DIAGNOSIS — M1711 Unilateral primary osteoarthritis, right knee: Principal | ICD-10-CM | POA: Insufficient documentation

## 2017-06-03 DIAGNOSIS — M25461 Effusion, right knee: Secondary | ICD-10-CM | POA: Diagnosis not present

## 2017-06-03 DIAGNOSIS — Z96659 Presence of unspecified artificial knee joint: Secondary | ICD-10-CM

## 2017-06-03 DIAGNOSIS — Z7982 Long term (current) use of aspirin: Secondary | ICD-10-CM | POA: Diagnosis not present

## 2017-06-03 DIAGNOSIS — M25761 Osteophyte, right knee: Secondary | ICD-10-CM | POA: Insufficient documentation

## 2017-06-03 DIAGNOSIS — M659 Synovitis and tenosynovitis, unspecified: Secondary | ICD-10-CM | POA: Insufficient documentation

## 2017-06-03 DIAGNOSIS — Z96652 Presence of left artificial knee joint: Secondary | ICD-10-CM | POA: Diagnosis not present

## 2017-06-03 DIAGNOSIS — E119 Type 2 diabetes mellitus without complications: Secondary | ICD-10-CM | POA: Diagnosis not present

## 2017-06-03 DIAGNOSIS — Z79899 Other long term (current) drug therapy: Secondary | ICD-10-CM | POA: Diagnosis not present

## 2017-06-03 DIAGNOSIS — G4730 Sleep apnea, unspecified: Secondary | ICD-10-CM | POA: Insufficient documentation

## 2017-06-03 DIAGNOSIS — I10 Essential (primary) hypertension: Secondary | ICD-10-CM | POA: Diagnosis not present

## 2017-06-03 HISTORY — PX: TOTAL KNEE ARTHROPLASTY: SHX125

## 2017-06-03 LAB — TYPE AND SCREEN
ABO/RH(D): O POS
Antibody Screen: NEGATIVE

## 2017-06-03 LAB — GLUCOSE, CAPILLARY: Glucose-Capillary: 114 mg/dL — ABNORMAL HIGH (ref 65–99)

## 2017-06-03 SURGERY — ARTHROPLASTY, KNEE, TOTAL
Anesthesia: General | Site: Knee | Laterality: Right

## 2017-06-03 MED ORDER — 0.9 % SODIUM CHLORIDE (POUR BTL) OPTIME
TOPICAL | Status: DC | PRN
  Administered 2017-06-03: 1000 mL

## 2017-06-03 MED ORDER — CEFAZOLIN SODIUM-DEXTROSE 2-4 GM/100ML-% IV SOLN
2.0000 g | Freq: Four times a day (QID) | INTRAVENOUS | Status: AC
  Administered 2017-06-03 – 2017-06-04 (×2): 2 g via INTRAVENOUS
  Filled 2017-06-03 (×2): qty 100

## 2017-06-03 MED ORDER — BISACODYL 10 MG RE SUPP: 10.0000 mg | Freq: Every day | RECTAL | Status: DC | PRN

## 2017-06-03 MED ORDER — STERILE WATER FOR IRRIGATION IR SOLN
Status: DC | PRN
  Administered 2017-06-03: 2000 mL

## 2017-06-03 MED ORDER — KETOROLAC TROMETHAMINE 30 MG/ML IJ SOLN
INTRAMUSCULAR | Status: AC
  Filled 2017-06-03: qty 1

## 2017-06-03 MED ORDER — DOCUSATE SODIUM 100 MG PO CAPS
100.0000 mg | ORAL_CAPSULE | Freq: Two times a day (BID) | ORAL | Status: DC
  Administered 2017-06-03 – 2017-06-04 (×2): 100 mg via ORAL
  Filled 2017-06-03 (×2): qty 1

## 2017-06-03 MED ORDER — ONDANSETRON HCL 4 MG PO TABS: 4.0000 mg | ORAL_TABLET | Freq: Four times a day (QID) | ORAL | Status: DC | PRN

## 2017-06-03 MED ORDER — METOCLOPRAMIDE HCL 5 MG/ML IJ SOLN: 5.0000 mg | Freq: Three times a day (TID) | INTRAMUSCULAR | Status: DC | PRN

## 2017-06-03 MED ORDER — EPHEDRINE 5 MG/ML INJ
INTRAVENOUS | Status: AC
  Filled 2017-06-03: qty 10

## 2017-06-03 MED ORDER — ONDANSETRON HCL 4 MG/2ML IJ SOLN
INTRAMUSCULAR | Status: DC | PRN
  Administered 2017-06-03: 4 mg via INTRAVENOUS

## 2017-06-03 MED ORDER — HYDROMORPHONE HCL 1 MG/ML IJ SOLN
0.2500 mg | INTRAMUSCULAR | Status: DC | PRN
  Administered 2017-06-03: 0.5 mg via INTRAVENOUS
  Administered 2017-06-03: 0.25 mg via INTRAVENOUS
  Administered 2017-06-03: 0.5 mg via INTRAVENOUS
  Administered 2017-06-03: 0.25 mg via INTRAVENOUS
  Administered 2017-06-03: 0.5 mg via INTRAVENOUS

## 2017-06-03 MED ORDER — ROCURONIUM BROMIDE 10 MG/ML (PF) SYRINGE
PREFILLED_SYRINGE | INTRAVENOUS | Status: DC | PRN
  Administered 2017-06-03: 70 mg via INTRAVENOUS
  Administered 2017-06-03: 20 mg via INTRAVENOUS

## 2017-06-03 MED ORDER — LACTATED RINGERS IV SOLN
INTRAVENOUS | Status: DC
  Administered 2017-06-03 (×2): via INTRAVENOUS

## 2017-06-03 MED ORDER — SUGAMMADEX SODIUM 200 MG/2ML IV SOLN
INTRAVENOUS | Status: DC | PRN
  Administered 2017-06-03: 400 mg via INTRAVENOUS

## 2017-06-03 MED ORDER — KETAMINE HCL 10 MG/ML IJ SOLN
INTRAMUSCULAR | Status: AC
  Filled 2017-06-03: qty 1

## 2017-06-03 MED ORDER — BUPIVACAINE HCL (PF) 0.25 % IJ SOLN
INTRAMUSCULAR | Status: AC
  Filled 2017-06-03: qty 30

## 2017-06-03 MED ORDER — PHENOL 1.4 % MT LIQD: 1.0000 | OROMUCOSAL | Status: DC | PRN

## 2017-06-03 MED ORDER — PHENYLEPHRINE 40 MCG/ML (10ML) SYRINGE FOR IV PUSH (FOR BLOOD PRESSURE SUPPORT)
PREFILLED_SYRINGE | INTRAVENOUS | Status: DC | PRN
  Administered 2017-06-03 (×3): 80 ug via INTRAVENOUS

## 2017-06-03 MED ORDER — ALUM & MAG HYDROXIDE-SIMETH 200-200-20 MG/5ML PO SUSP
15.0000 mL | ORAL | Status: DC | PRN
  Administered 2017-06-04: 04:00:00 15 mL via ORAL
  Filled 2017-06-03: qty 30

## 2017-06-03 MED ORDER — HYDROMORPHONE HCL 1 MG/ML IJ SOLN
0.2500 mg | INTRAMUSCULAR | Status: DC | PRN
  Administered 2017-06-03 (×2): 0.5 mg via INTRAVENOUS

## 2017-06-03 MED ORDER — ONDANSETRON HCL 4 MG/2ML IJ SOLN
INTRAMUSCULAR | Status: AC
  Filled 2017-06-03: qty 2

## 2017-06-03 MED ORDER — SODIUM CHLORIDE 0.9 % IJ SOLN
INTRAMUSCULAR | Status: AC
  Filled 2017-06-03: qty 50

## 2017-06-03 MED ORDER — ONDANSETRON HCL 4 MG/2ML IJ SOLN: 4.0000 mg | Freq: Four times a day (QID) | INTRAMUSCULAR | Status: DC | PRN

## 2017-06-03 MED ORDER — ROPIVACAINE HCL 7.5 MG/ML IJ SOLN
INTRAMUSCULAR | Status: DC | PRN
  Administered 2017-06-03: 20 mL via PERINEURAL

## 2017-06-03 MED ORDER — METFORMIN HCL 500 MG PO TABS
1000.0000 mg | ORAL_TABLET | Freq: Two times a day (BID) | ORAL | Status: DC
  Administered 2017-06-04: 09:00:00 1000 mg via ORAL
  Filled 2017-06-03: qty 2

## 2017-06-03 MED ORDER — MENTHOL 3 MG MT LOZG: 1.0000 | LOZENGE | OROMUCOSAL | Status: DC | PRN

## 2017-06-03 MED ORDER — PROPOFOL 10 MG/ML IV BOLUS
INTRAVENOUS | Status: DC | PRN
  Administered 2017-06-03: 240 mg via INTRAVENOUS

## 2017-06-03 MED ORDER — OXYCODONE HCL 5 MG/5ML PO SOLN
5.0000 mg | Freq: Once | ORAL | Status: DC | PRN
  Filled 2017-06-03: qty 5

## 2017-06-03 MED ORDER — OXYCODONE HCL 5 MG PO TABS: 5.0000 mg | ORAL_TABLET | Freq: Once | ORAL | Status: DC | PRN

## 2017-06-03 MED ORDER — FENTANYL CITRATE (PF) 250 MCG/5ML IJ SOLN
INTRAMUSCULAR | Status: AC
  Filled 2017-06-03: qty 5

## 2017-06-03 MED ORDER — AMLODIPINE BESYLATE 5 MG PO TABS
5.0000 mg | ORAL_TABLET | Freq: Every day | ORAL | Status: DC
  Administered 2017-06-03: 5 mg via ORAL
  Filled 2017-06-03 (×2): qty 1

## 2017-06-03 MED ORDER — METHOCARBAMOL 500 MG PO TABS
500.0000 mg | ORAL_TABLET | Freq: Four times a day (QID) | ORAL | Status: DC | PRN
  Administered 2017-06-03 – 2017-06-04 (×2): 500 mg via ORAL
  Filled 2017-06-03 (×2): qty 1

## 2017-06-03 MED ORDER — FENTANYL CITRATE (PF) 100 MCG/2ML IJ SOLN
50.0000 ug | INTRAMUSCULAR | Status: DC
  Administered 2017-06-03: 50 ug via INTRAVENOUS
  Filled 2017-06-03: qty 2

## 2017-06-03 MED ORDER — SUCCINYLCHOLINE CHLORIDE 200 MG/10ML IV SOSY
PREFILLED_SYRINGE | INTRAVENOUS | Status: DC | PRN
  Administered 2017-06-03: 140 mg via INTRAVENOUS

## 2017-06-03 MED ORDER — DEXAMETHASONE SODIUM PHOSPHATE 10 MG/ML IJ SOLN
INTRAMUSCULAR | Status: AC
  Filled 2017-06-03: qty 1

## 2017-06-03 MED ORDER — CELECOXIB 200 MG PO CAPS
200.0000 mg | ORAL_CAPSULE | Freq: Two times a day (BID) | ORAL | Status: DC
  Administered 2017-06-03 – 2017-06-04 (×2): 200 mg via ORAL
  Filled 2017-06-03 (×2): qty 1

## 2017-06-03 MED ORDER — OXYCODONE HCL 5 MG PO TABS
10.0000 mg | ORAL_TABLET | ORAL | Status: DC | PRN
  Administered 2017-06-03 – 2017-06-04 (×4): 10 mg via ORAL
  Filled 2017-06-03 (×4): qty 2

## 2017-06-03 MED ORDER — KETOROLAC TROMETHAMINE 30 MG/ML IJ SOLN
INTRAMUSCULAR | Status: DC | PRN
  Administered 2017-06-03: 30 mg

## 2017-06-03 MED ORDER — SODIUM CHLORIDE 0.9 % IV SOLN
INTRAVENOUS | Status: DC
  Administered 2017-06-03 – 2017-06-04 (×2): via INTRAVENOUS

## 2017-06-03 MED ORDER — DEXAMETHASONE SODIUM PHOSPHATE 10 MG/ML IJ SOLN
INTRAMUSCULAR | Status: DC | PRN
  Administered 2017-06-03: 10 mg via INTRAVENOUS

## 2017-06-03 MED ORDER — POLYETHYLENE GLYCOL 3350 17 G PO PACK
17.0000 g | PACK | Freq: Two times a day (BID) | ORAL | Status: DC
  Administered 2017-06-03 – 2017-06-04 (×2): 17 g via ORAL
  Filled 2017-06-03 (×2): qty 1

## 2017-06-03 MED ORDER — CHLORHEXIDINE GLUCONATE 4 % EX LIQD: 60.0000 mL | Freq: Once | CUTANEOUS | Status: DC

## 2017-06-03 MED ORDER — MAGNESIUM CITRATE PO SOLN: 1.0000 | Freq: Once | ORAL | Status: DC | PRN

## 2017-06-03 MED ORDER — GABAPENTIN 300 MG PO CAPS
900.0000 mg | ORAL_CAPSULE | Freq: Three times a day (TID) | ORAL | Status: DC
  Administered 2017-06-03 – 2017-06-04 (×2): 900 mg via ORAL
  Filled 2017-06-03 (×2): qty 3

## 2017-06-03 MED ORDER — DIPHENHYDRAMINE HCL 12.5 MG/5ML PO ELIX: 12.5000 mg | ORAL_SOLUTION | ORAL | Status: DC | PRN

## 2017-06-03 MED ORDER — FENTANYL CITRATE (PF) 250 MCG/5ML IJ SOLN
INTRAMUSCULAR | Status: DC | PRN
  Administered 2017-06-03 (×2): 50 ug via INTRAVENOUS
  Administered 2017-06-03: 100 ug via INTRAVENOUS
  Administered 2017-06-03 (×3): 50 ug via INTRAVENOUS

## 2017-06-03 MED ORDER — METOCLOPRAMIDE HCL 5 MG PO TABS: 5.0000 mg | ORAL_TABLET | Freq: Three times a day (TID) | ORAL | Status: DC | PRN

## 2017-06-03 MED ORDER — FENTANYL CITRATE (PF) 100 MCG/2ML IJ SOLN
INTRAMUSCULAR | Status: AC
  Filled 2017-06-03: qty 2

## 2017-06-03 MED ORDER — GLIPIZIDE ER 5 MG PO TB24
5.0000 mg | ORAL_TABLET | Freq: Every day | ORAL | Status: DC
  Administered 2017-06-04: 5 mg via ORAL
  Filled 2017-06-03: qty 1

## 2017-06-03 MED ORDER — FERROUS SULFATE 325 (65 FE) MG PO TABS
325.0000 mg | ORAL_TABLET | Freq: Three times a day (TID) | ORAL | Status: DC
  Administered 2017-06-04: 09:00:00 325 mg via ORAL
  Filled 2017-06-03: qty 1

## 2017-06-03 MED ORDER — OXYCODONE HCL 5 MG PO TABS
20.0000 mg | ORAL_TABLET | ORAL | Status: DC | PRN
  Administered 2017-06-03 – 2017-06-04 (×2): 20 mg via ORAL
  Filled 2017-06-03 (×2): qty 4

## 2017-06-03 MED ORDER — METHOCARBAMOL 1000 MG/10ML IJ SOLN
500.0000 mg | Freq: Four times a day (QID) | INTRAMUSCULAR | Status: DC | PRN
  Administered 2017-06-03: 500 mg via INTRAVENOUS
  Filled 2017-06-03: qty 550

## 2017-06-03 MED ORDER — KETAMINE HCL 10 MG/ML IJ SOLN
INTRAMUSCULAR | Status: DC | PRN
  Administered 2017-06-03: 80 mg via INTRAVENOUS

## 2017-06-03 MED ORDER — ASPIRIN 81 MG PO CHEW
81.0000 mg | CHEWABLE_TABLET | Freq: Two times a day (BID) | ORAL | Status: DC
  Administered 2017-06-03 – 2017-06-04 (×2): 81 mg via ORAL
  Filled 2017-06-03 (×2): qty 1

## 2017-06-03 MED ORDER — ATORVASTATIN CALCIUM 20 MG PO TABS
20.0000 mg | ORAL_TABLET | Freq: Every day | ORAL | Status: DC
  Administered 2017-06-04: 09:00:00 20 mg via ORAL
  Filled 2017-06-03: qty 1

## 2017-06-03 MED ORDER — DEXAMETHASONE SODIUM PHOSPHATE 10 MG/ML IJ SOLN: 10.0000 mg | Freq: Once | INTRAMUSCULAR | Status: DC

## 2017-06-03 MED ORDER — SODIUM CHLORIDE 0.9 % IV SOLN
1000.0000 mg | INTRAVENOUS | Status: AC
  Administered 2017-06-03: 1000 mg via INTRAVENOUS
  Filled 2017-06-03: qty 1100

## 2017-06-03 MED ORDER — ACETAMINOPHEN 500 MG PO TABS
1000.0000 mg | ORAL_TABLET | Freq: Four times a day (QID) | ORAL | Status: AC
  Administered 2017-06-03 – 2017-06-04 (×4): 1000 mg via ORAL
  Filled 2017-06-03 (×4): qty 2

## 2017-06-03 MED ORDER — HYDROMORPHONE HCL 1 MG/ML IJ SOLN
INTRAMUSCULAR | Status: AC
  Filled 2017-06-03: qty 2

## 2017-06-03 MED ORDER — PROPOFOL 10 MG/ML IV BOLUS
INTRAVENOUS | Status: AC
  Filled 2017-06-03: qty 40

## 2017-06-03 MED ORDER — HYDROMORPHONE HCL 1 MG/ML IJ SOLN
INTRAMUSCULAR | Status: DC | PRN
  Administered 2017-06-03 (×2): 1 mg via INTRAVENOUS

## 2017-06-03 MED ORDER — BUPIVACAINE HCL (PF) 0.25 % IJ SOLN
INTRAMUSCULAR | Status: DC | PRN
  Administered 2017-06-03: 30 mL

## 2017-06-03 MED ORDER — HYDROMORPHONE HCL 1 MG/ML IJ SOLN
INTRAMUSCULAR | Status: AC
  Filled 2017-06-03: qty 1

## 2017-06-03 MED ORDER — SUGAMMADEX SODIUM 500 MG/5ML IV SOLN
INTRAVENOUS | Status: AC
  Filled 2017-06-03: qty 5

## 2017-06-03 MED ORDER — LIDOCAINE 2% (20 MG/ML) 5 ML SYRINGE
INTRAMUSCULAR | Status: DC | PRN
  Administered 2017-06-03: 75 mg via INTRAVENOUS

## 2017-06-03 MED ORDER — SODIUM CHLORIDE 0.9 % IJ SOLN
INTRAMUSCULAR | Status: DC | PRN
  Administered 2017-06-03: 29 mL

## 2017-06-03 MED ORDER — HYDROMORPHONE HCL 2 MG/ML IJ SOLN
INTRAMUSCULAR | Status: AC
  Filled 2017-06-03: qty 1

## 2017-06-03 MED ORDER — TRANEXAMIC ACID 1000 MG/10ML IV SOLN
1000.0000 mg | Freq: Once | INTRAVENOUS | Status: AC
  Administered 2017-06-03: 19:00:00 1000 mg via INTRAVENOUS
  Filled 2017-06-03: qty 1100

## 2017-06-03 MED ORDER — PHENYLEPHRINE 40 MCG/ML (10ML) SYRINGE FOR IV PUSH (FOR BLOOD PRESSURE SUPPORT)
PREFILLED_SYRINGE | INTRAVENOUS | Status: AC
  Filled 2017-06-03: qty 10

## 2017-06-03 MED ORDER — SODIUM CHLORIDE 0.9 % IR SOLN
Status: DC | PRN
  Administered 2017-06-03: 1000 mL

## 2017-06-03 MED ORDER — PROPOFOL 10 MG/ML IV BOLUS
INTRAVENOUS | Status: AC
  Filled 2017-06-03: qty 20

## 2017-06-03 MED ORDER — MIDAZOLAM HCL 2 MG/2ML IJ SOLN
1.0000 mg | INTRAMUSCULAR | Status: DC
  Administered 2017-06-03: 2 mg via INTRAVENOUS
  Filled 2017-06-03: qty 2

## 2017-06-03 MED ORDER — EPHEDRINE SULFATE-NACL 50-0.9 MG/10ML-% IV SOSY
PREFILLED_SYRINGE | INTRAVENOUS | Status: DC | PRN
  Administered 2017-06-03: 10 mg via INTRAVENOUS

## 2017-06-03 MED ORDER — HYDROMORPHONE HCL 1 MG/ML IJ SOLN: 0.5000 mg | INTRAMUSCULAR | Status: DC | PRN

## 2017-06-03 SURGICAL SUPPLY — 52 items
ADH SKN CLS APL DERMABOND .7 (GAUZE/BANDAGES/DRESSINGS) ×1
BAG SPEC THK2 15X12 ZIP CLS (MISCELLANEOUS) ×1
BAG ZIPLOCK 12X15 (MISCELLANEOUS) ×2 IMPLANT
BANDAGE ACE 6X5 VEL STRL LF (GAUZE/BANDAGES/DRESSINGS) ×3 IMPLANT
BLADE SAW SGTL 11.0X1.19X90.0M (BLADE) IMPLANT
BLADE SAW SGTL 13.0X1.19X90.0M (BLADE) ×3 IMPLANT
BONE CEMENT GENTAMICIN (Cement) ×6 IMPLANT
BOWL SMART MIX CTS (DISPOSABLE) ×3 IMPLANT
CAP KNEE TOTAL 3 SIGMA ×2 IMPLANT
CEMENT BONE GENTAMICIN 40 (Cement) IMPLANT
COVER SURGICAL LIGHT HANDLE (MISCELLANEOUS) ×3 IMPLANT
CUFF TOURN SGL QUICK 34 (TOURNIQUET CUFF) ×3
CUFF TRNQT CYL 34X4X40X1 (TOURNIQUET CUFF) ×1 IMPLANT
DECANTER SPIKE VIAL GLASS SM (MISCELLANEOUS) ×3 IMPLANT
DERMABOND ADVANCED (GAUZE/BANDAGES/DRESSINGS) ×2
DERMABOND ADVANCED .7 DNX12 (GAUZE/BANDAGES/DRESSINGS) ×1 IMPLANT
DRAPE U-SHAPE 47X51 STRL (DRAPES) ×3 IMPLANT
DRESSING AQUACEL AG SP 3.5X10 (GAUZE/BANDAGES/DRESSINGS) ×1 IMPLANT
DRSG AQUACEL AG SP 3.5X10 (GAUZE/BANDAGES/DRESSINGS) ×3
DURAPREP 26ML APPLICATOR (WOUND CARE) ×6 IMPLANT
ELECT REM PT RETURN 15FT ADLT (MISCELLANEOUS) ×3 IMPLANT
GLOVE BIOGEL PI IND STRL 7.0 (GLOVE) IMPLANT
GLOVE BIOGEL PI IND STRL 7.5 (GLOVE) ×1 IMPLANT
GLOVE BIOGEL PI IND STRL 8 (GLOVE) IMPLANT
GLOVE BIOGEL PI IND STRL 8.5 (GLOVE) ×1 IMPLANT
GLOVE BIOGEL PI INDICATOR 7.0 (GLOVE) ×4
GLOVE BIOGEL PI INDICATOR 7.5 (GLOVE) ×6
GLOVE BIOGEL PI INDICATOR 8 (GLOVE) ×2
GLOVE BIOGEL PI INDICATOR 8.5 (GLOVE) ×2
GLOVE ECLIPSE 7.5 STRL STRAW (GLOVE) ×2 IMPLANT
GLOVE ECLIPSE 8.0 STRL XLNG CF (GLOVE) ×6 IMPLANT
GLOVE ORTHO TXT STRL SZ7.5 (GLOVE) ×6 IMPLANT
GOWN STRL REUS W/ TWL XL LVL3 (GOWN DISPOSABLE) ×1 IMPLANT
GOWN STRL REUS W/TWL LRG LVL3 (GOWN DISPOSABLE) ×3 IMPLANT
GOWN STRL REUS W/TWL XL LVL3 (GOWN DISPOSABLE) ×8 IMPLANT
HANDPIECE INTERPULSE COAX TIP (DISPOSABLE) ×3
MANIFOLD NEPTUNE II (INSTRUMENTS) ×3 IMPLANT
PACK TOTAL KNEE CUSTOM (KITS) ×3 IMPLANT
POSITIONER SURGICAL ARM (MISCELLANEOUS) ×3 IMPLANT
SET HNDPC FAN SPRY TIP SCT (DISPOSABLE) ×1 IMPLANT
SET PAD KNEE POSITIONER (MISCELLANEOUS) ×3 IMPLANT
SPONGE LAP 18X18 X RAY DECT (DISPOSABLE) ×2 IMPLANT
SUT MNCRL AB 4-0 PS2 18 (SUTURE) ×3 IMPLANT
SUT STRATAFIX PDS+ 0 24IN (SUTURE) ×3 IMPLANT
SUT VIC AB 1 CT1 36 (SUTURE) ×3 IMPLANT
SUT VIC AB 2-0 CT1 27 (SUTURE) ×9
SUT VIC AB 2-0 CT1 TAPERPNT 27 (SUTURE) ×3 IMPLANT
SYR 50ML LL SCALE MARK (SYRINGE) ×3 IMPLANT
TRAY FOLEY W/METER SILVER 16FR (SET/KITS/TRAYS/PACK) ×3 IMPLANT
TRAY REVISION SZ 4 (Knees) ×2 IMPLANT
WRAP KNEE MAXI GEL POST OP (GAUZE/BANDAGES/DRESSINGS) ×3 IMPLANT
YANKAUER SUCT BULB TIP 10FT TU (MISCELLANEOUS) ×3 IMPLANT

## 2017-06-03 NOTE — Progress Notes (Signed)
In attempt to document narcotic waste on patient after nerve block, inadvertently documented waste on narcotic documentation for CRNA Reardon's dose pulled.of Fentanyl  Discussed situation with pharmacist.  Corrected waste [facility]. Previous dose wasted [place] to [place] F[place].

## 2017-06-03 NOTE — Discharge Instructions (Signed)

## 2017-06-03 NOTE — Anesthesia Procedure Notes (Signed)
Anesthesia Regional Block: Adductor canal block   Pre-Anesthetic Checklist: ,, timeout performed, Correct Patient, Correct Site, Correct Laterality, Correct Procedure, Correct Position, site marked, Risks and benefits discussed,  Surgical consent,  Pre-op evaluation,  At surgeon's request and post-op pain management  Laterality: Right  Prep: chloraprep       Needles:  Injection technique: Single-shot  Needle Type: Echogenic Needle     Needle Length: 9cm  Needle Gauge: 21     Additional Needles:   Narrative:  Start time: 06/03/2017 12:12 PM End time: 06/03/2017 12:21 PM Injection made incrementally with aspirations every 5 mL.  Performed by: Personally  Anesthesiologist: Achille RichHodierne, Jayce Boyko, MD  Additional Notes: Pt tolerated the procedure well.

## 2017-06-03 NOTE — Op Note (Signed)
NAME:  Frank Hensley                      MEDICAL RECORD NO.:  [id number]                             FACILITY:  [facility]      PHYSICIAN:  Madlyn Frankel. Charlann Boxer, M.D.  DATE OF BIRTH:  [birth year]      DATE OF PROCEDURE:  06/03/2017                                     OPERATIVE REPORT         PREOPERATIVE DIAGNOSIS:  Right knee osteoarthritis.      POSTOPERATIVE DIAGNOSIS:  Right knee osteoarthritis.      FINDINGS:  The patient was noted to have complete loss of cartilage and   bone-on-bone arthritis with associated osteophytes in the lateral and patellofemoral compartments of   the knee with a significant synovitis and associated effusion.      PROCEDURE:  Right total knee replacement.      COMPONENTS USED:  De[employer] Sigma rotating platform posterior stabilized knee   system, a size 5 femur, 4 MBT revision tibial tray (due to obesity concerns), size 10 mm PS insert, and 41 patellar   button.      SURGEON:  Madlyn Frankel. Charlann Boxer, M.D.      ASSISTANT:  Lanney Gins, PA-C.      ANESTHESIA:  General and Regional.      SPECIMENS:  None.      COMPLICATION:  None.      DRAINS:  None.  EBL: <150cc      TOURNIQUET TIME:  44 min at 250 mmHg     The patient was stable to the recovery room.      INDICATION FOR PROCEDURE:  Frank Hensley is a 52 y.o. male patient of   mine.  The patient had been seen, evaluated, and treated conservatively in the   office with medication, activity modification, and injections.  The patient had   radiographic changes of bone-on-bone arthritis with endplate sclerosis and osteophytes noted.      The patient failed conservative measures including medication, injections, and activity modification, and at this point was ready for more definitive measures.   Based on the radiographic changes and failed conservative measures, the patient   decided to proceed with total knee replacement.  Risks of infection,   DVT, component failure, need for revision surgery, postop  course, and   expectations were all   discussed and reviewed.  Consent was obtained for benefit of pain   relief.      PROCEDURE IN DETAIL:  The patient was brought to the operative theater.   Once adequate anesthesia, preoperative antibiotics, 3 gm of Ancef, 1 gm of Tranexamic Acid, and 10 mg of Decadron administered, the patient was positioned supine with the right thigh tourniquet placed.  The  right lower extremity was prepped and draped in sterile fashion.  A time-   out was performed identifying the patient, planned procedure, and   extremity.      The right lower extremity was placed in the [facility] leg holder.  The leg was   exsanguinated, tourniquet elevated to 250 mmHg.  A midline incision was   made followed by median parapatellar arthrotomy.  Following  initial   exposure, attention was first directed to the patella.  Precut   measurement was noted to be 27 mm.  I resected down to 15 mm and used a   41 patellar button to restore patellar height as well as cover the cut   surface.      The lug holes were drilled and a metal shim was placed to protect the   patella from retractors and saw blades.      At this point, attention was now directed to the femur.  The femoral   canal was opened with a drill, irrigated to try to prevent fat emboli.  An   intramedullary rod was passed at 5 degrees valgus, 10 mm of bone was   resected off the distal femur.  Following this resection, the tibia was   subluxated anteriorly.  Using the extramedullary guide, 2 mm of bone was resected off   the proximal lateral tibia.  We confirmed the gap would be   stable medially and laterally with a 10 mm insert as well as confirmed   the cut was perpendicular in the coronal plane, checking with an alignment rod.      Once this was done, I sized the femur to be a size 5 in the anterior-   posterior dimension, chose a standard component based on medial and   lateral dimension.  The size 5 rotation block  was then pinned in   position anterior referenced using the C-clamp to set rotation.  The   anterior, posterior, and  chamfer cuts were made without difficulty nor   notching making certain that I was along the anterior cortex to help   with flexion gap stability.      The final box cut was made off the lateral aspect of distal femur.      At this point, the tibia was sized to be a size 4, the size 4 tray was   then pinned in position through the medial third of the tubercle,   drilled, and keel punched for the MBT revision tray.  Trial reduction was now carried with a 5 femur,  4 MBT tibia tray, a 10 mm insert, and the 41 patella botton.  The knee was brought to   extension, full extension with good flexion stability with the patella   tracking through the trochlea without application of pressure.  Given   all these findings, the trial components removed.  Final components were   opened and cement was mixed.  The knee was irrigated with normal saline   solution and pulse lavage.  The synovial lining was   then injected with 30 cc of 0.25% Marcaine with epinephrine and 1 cc of Toradol plus 30 cc of NS for a total of 61 cc.      The knee was irrigated.  Final implants were then cemented onto clean and   dried cut surfaces of bone with the knee brought to extension with a size 10   mm PS trial insert.      Once the cement had fully cured, the excess cement was removed   throughout the knee.  I confirmed I was satisfied with the range of   motion and stability, and the final size 10 mm PS insert was chosen.  It was   placed into the knee.      The tourniquet had been let down at 44 minutes.  No significant   hemostasis required.  The  extensor mechanism was then reapproximated using #1 Vicryl and #1 Stratafix sutures with the knee   in flexion.  The   remaining wound was closed with 2-0 Vicryl and running 4-0 Monocryl.   The knee was cleaned, dried, dressed sterilely using Dermabond  and   Aquacel dressing.  The patient was then   brought to recovery room in stable condition, tolerating the procedure   well.   Please note that Physician Assistant, Lanney Gins, PA-C, was present for the entirety of the case, and was utilized for pre-operative positioning, peri-operative retractor management, general facilitation of the procedure.  He was also utilized for primary wound closure at the end of the case.              Madlyn Frankel Charlann Boxer, M.D.    06/03/2017 12:47 PM

## 2017-06-03 NOTE — Transfer of Care (Signed)
Immediate Anesthesia Transfer of Care Note  Patient: Frank Hensley  Procedure(s) Performed: RIGHT TOTAL KNEE ARTHROPLASTY AND REMOVAL OF HARDWARE (Right Knee)  Patient Location: PACU  Anesthesia Type:GA combined with regional for post-op pain  Level of Consciousness: awake, alert  and oriented  Airway & Oxygen Therapy: Patient Spontanous Breathing and Patient connected to face mask oxygen  Post-op Assessment: Report given to RN and Post -op Vital signs reviewed and stable  Post vital signs: Reviewed and stable  Last Vitals:  Vitals:   06/03/17 1229 06/03/17 1230  BP:  (!) 150/80  Pulse: 79 81  Resp: 19   Temp:    SpO2: 100% 100%    Last Pain:  Vitals:   06/03/17 1144  TempSrc:   PainSc: 4          Complications: No apparent anesthesia complications

## 2017-06-03 NOTE — Anesthesia Preprocedure Evaluation (Signed)
Anesthesia Evaluation  Patient identified by MRN, date of birth, ID band Patient awake    Reviewed: Allergy & Precautions, H&P , NPO status , Patient's Chart, lab work & pertinent test results  Airway Mallampati: II   Neck ROM: full    Dental   Pulmonary sleep apnea ,    breath sounds clear to auscultation       Cardiovascular hypertension, + angina  Rhythm:regular Rate:Normal     Neuro/Psych    GI/Hepatic GERD  ,  Endo/Other  diabetes, Type 2  Renal/GU      Musculoskeletal  (+) Arthritis ,   Abdominal   Peds  Hematology   Anesthesia Other Findings   Reproductive/Obstetrics                             Anesthesia Physical Anesthesia Plan  ASA: II  Anesthesia Plan: Spinal   Post-op Pain Management:  Regional for Post-op pain   Induction: Intravenous  PONV Risk Score and Plan: 1 and Ondansetron, Propofol infusion, Midazolam, Treatment may vary due to age or medical condition and Dexamethasone  Airway Management Planned: Simple Face Mask  Additional Equipment:   Intra-op Plan:   Post-operative Plan:   Informed Consent: I have reviewed the patients History and Physical, chart, labs and discussed the procedure including the risks, benefits and alternatives for the proposed anesthesia with the patient or authorized representative who has indicated his/her understanding and acceptance.     Plan Discussed with: CRNA, Anesthesiologist and Surgeon  Anesthesia Plan Comments:         Anesthesia Quick Evaluation

## 2017-06-03 NOTE — Plan of Care (Signed)
Plan of care 

## 2017-06-03 NOTE — Anesthesia Procedure Notes (Signed)
Procedure Name: Intubation Date/Time: 06/03/2017 12:44 PM Performed by: Florene Routeeardon, Valeri Sula L, CRNA Patient Re-evaluated:Patient Re-evaluated prior to induction Oxygen Delivery Method: Circle system utilized Preoxygenation: Pre-oxygenation with 100% oxygen Induction Type: IV induction Laryngoscope Size: Glidescope and 4 Grade View: Grade I Tube type: Oral Tube size: 8.0 mm Number of attempts: 1 Airway Equipment and Method: Video-laryngoscopy and Stylet Placement Confirmation: ETT inserted through vocal cords under direct vision,  positive ETCO2 and breath sounds checked- equal and bilateral Secured at: 22 cm Tube secured with: Tape Dental Injury: Teeth and Oropharynx as per pre-operative assessment  Difficulty Due To: Difficulty was anticipated, Difficult Airway- due to large tongue and Difficult Airway- due to reduced neck mobility

## 2017-06-03 NOTE — Progress Notes (Signed)
Assisted Dr. Hodierne with right, ultrasound guided, adductor canal block. Side rails up, monitors on throughout procedure. See vital signs in flow sheet. Tolerated Procedure well.  

## 2017-06-03 NOTE — Interval H&P Note (Signed)
History and Physical Interval Note:  06/03/2017 11:17 AM  Frank Hensley  has presented today for surgery, with the diagnosis of Right knee osteoarthritis, painful retained hardware  The various methods of treatment have been discussed with the patient and family. After consideration of risks, benefits and other options for treatment, the patient has consented to  Procedure(s) with comments: RIGHT TOTAL KNEE ARTHROPLASTY, POSSIBLE REMOVAL OF HARDWARE (Right) - 90 mins as a surgical intervention .  The patient's history has been reviewed, patient examined, no change in status, stable for surgery.  I have reviewed the patient's chart and labs.  Questions were answered to the patient's satisfaction.     Shelda PalMatthew D Nasiya Pascual

## 2017-06-04 ENCOUNTER — Encounter (HOSPITAL_COMMUNITY): Payer: Self-pay | Admitting: Orthopedic Surgery

## 2017-06-04 DIAGNOSIS — M1711 Unilateral primary osteoarthritis, right knee: Secondary | ICD-10-CM | POA: Diagnosis not present

## 2017-06-04 LAB — CBC
HCT: 32.8 % — ABNORMAL LOW (ref 39.0–52.0)
Hemoglobin: 11 g/dL — ABNORMAL LOW (ref 13.0–17.0)
MCH: 28.1 pg (ref 26.0–34.0)
MCHC: 33.5 g/dL (ref 30.0–36.0)
MCV: 83.7 fL (ref 78.0–100.0)
PLATELETS: 192 10*3/uL (ref 150–400)
RBC: 3.92 MIL/uL — ABNORMAL LOW (ref 4.22–5.81)
RDW: 13.8 % (ref 11.5–15.5)
WBC: 11.9 10*3/uL — AB (ref 4.0–10.5)

## 2017-06-04 LAB — BASIC METABOLIC PANEL
ANION GAP: 10 (ref 5–15)
BUN: 17 mg/dL (ref 6–20)
CALCIUM: 8.7 mg/dL — AB (ref 8.9–10.3)
CO2: 23 mmol/L (ref 22–32)
Chloride: 105 mmol/L (ref 101–111)
Creatinine, Ser: 1.22 mg/dL (ref 0.61–1.24)
GFR calc Af Amer: >60 mL/min (ref >60)
Glucose, Bld: 228 mg/dL — ABNORMAL HIGH (ref 65–99)
Potassium: 4.2 mmol/L (ref 3.5–5.1)
Sodium: 138 mmol/L (ref 135–145)

## 2017-06-04 MED ORDER — METHOCARBAMOL 500 MG PO TABS: 500.0000 mg | ORAL_TABLET | Freq: Four times a day (QID) | ORAL | 0 refills | Status: DC | PRN

## 2017-06-04 MED ORDER — CELECOXIB 200 MG PO CAPS: 200.0000 mg | ORAL_CAPSULE | Freq: Two times a day (BID) | ORAL | 0 refills | Status: DC

## 2017-06-04 MED ORDER — POLYETHYLENE GLYCOL 3350 17 G PO PACK: 17.0000 g | PACK | Freq: Two times a day (BID) | ORAL | 0 refills | Status: DC

## 2017-06-04 MED ORDER — FERROUS SULFATE 325 (65 FE) MG PO TABS: 325.0000 mg | ORAL_TABLET | Freq: Three times a day (TID) | ORAL | 3 refills | Status: DC

## 2017-06-04 MED ORDER — OXYCODONE HCL 5 MG PO TABS: 5.0000 mg | ORAL_TABLET | ORAL | 0 refills | Status: DC | PRN

## 2017-06-04 MED ORDER — ASPIRIN 81 MG PO CHEW: 81.0000 mg | CHEWABLE_TABLET | Freq: Two times a day (BID) | ORAL | 0 refills | Status: AC

## 2017-06-04 MED ORDER — ACETAMINOPHEN 500 MG PO TABS: 1000.0000 mg | ORAL_TABLET | Freq: Three times a day (TID) | ORAL | 0 refills | Status: AC

## 2017-06-04 MED ORDER — DOCUSATE SODIUM 100 MG PO CAPS: 100.0000 mg | ORAL_CAPSULE | Freq: Two times a day (BID) | ORAL | 0 refills | Status: DC

## 2017-06-04 NOTE — Evaluation (Signed)
Physical Therapy Evaluation Patient Details Name: Frank Hensley MRN: [id number] DOB: 02/20/1966 Today's Date: 06/04/2017   History of Present Illness  52 yo male s/p R TKA 06/03/17. hx of L TK revision 2016, DM, sleep apnea, spinal stenosis-laminectomy 2015  Clinical Impression  On eval, pt was supervision level assist for mobility. He walked ~150 feet with a RW. Pain rated 5/10 during session. Reviewed exercises, gait training and stair training. Pt is familiar with process-multiple surgeries. Issued HEP for pt to perform 2x/day until he begins OP PT. All education completed. Okay to d/c from PT standpoint-made RN aware.     Follow Up Recommendations Follow surgeon's recommendation for DC plan and follow-up therapies    Equipment Recommendations  None recommended by PT    Recommendations for Other Services       Precautions / Restrictions Precautions Precautions: Fall Restrictions Weight Bearing Restrictions: No RLE Weight Bearing: Weight bearing as tolerated      Mobility  Bed Mobility Overal bed mobility: Modified Independent                Transfers Overall transfer level: Needs assistance Equipment used: Rolling walker (2 wheeled) Transfers: Sit to/from Stand Sit to Stand: Supervision         General transfer comment: for safety.   Ambulation/Gait Ambulation/Gait assistance: Supervision Ambulation Distance (Feet): 150 Feet Assistive device: Rolling walker (2 wheeled) Gait Pattern/deviations: Step-to pattern;Step-through pattern;Decreased stride length     General Gait Details: for safety.   Stairs Stairs: Yes Supervision Stair Management: One rail Right;Forwards;Step to pattern Number of Stairs: 5 General stair comments: up and overt portable steps x 2. VCs safety, sequence.   Wheelchair Mobility    Modified Rankin (Stroke Patients Only)       Balance                                             Pertinent  Vitals/Pain Pain Assessment: 0-10 Pain Score: 5  Pain Location: R thigh Pain Descriptors / Indicators: Sore;Aching Pain Intervention(s): Monitored during session;Repositioned;Patient requesting pain meds-RN notified    Home Living Family/patient expects to be discharged to:: Private residence Living Arrangements: Spouse/significant other Available Help at Discharge: Family Type of Home: House Home Access: Stairs to enter   Secretary/administrator of Steps: 1 Home Layout: Two level Home Equipment: Environmental consultant - 2 wheels      Prior Function Level of Independence: Independent               Hand Dominance        Extremity/Trunk Assessment   Upper Extremity Assessment Upper Extremity Assessment: Overall WFL for tasks assessed    Lower Extremity Assessment Lower Extremity Assessment: Generalized weakness(s/p R TKA)    Cervical / Trunk Assessment Cervical / Trunk Assessment: Normal  Communication   Communication: No difficulties  Cognition Arousal/Alertness: Awake/alert Behavior During Therapy: WFL for tasks assessed/performed Overall Cognitive Status: Within Functional Limits for tasks assessed                                        General Comments      Exercises Total Joint Exercises Ankle Circles/Pumps: AROM;Both;10 reps;Supine Quad Sets: AROM;Both;10 reps;Supine Heel Slides: Right;10 reps;Supine;AROM Straight Leg Raises: AROM;Right;10 reps;Supine Goniometric ROM: ~10-65 degrees   Assessment/Plan  PT Assessment Patient needs continued PT services  PT Problem List Decreased strength;Decreased balance;Decreased mobility;Decreased range of motion;Decreased activity tolerance;Obesity;Decreased knowledge of use of DME;Pain       PT Treatment Interventions DME instruction;Gait training;Functional mobility training;Therapeutic activities;Balance training;Patient/family education;Therapeutic exercise;Stair training    PT Goals (Current goals can  be found in the Care Plan section)  Acute Rehab PT Goals Patient Stated Goal: regain independence/return to PLOF PT Goal Formulation: With patient Time For Goal Achievement: 06/18/17 Potential to Achieve Goals: Good    Frequency 7X/week   Barriers to discharge        Co-evaluation               AM-PAC PT "6 Clicks" Daily Activity  Outcome Measure Difficulty turning over in bed (including adjusting bedclothes, sheets and blankets)?: None Difficulty moving from lying on back to sitting on the side of the bed? : None Difficulty sitting down on and standing up from a chair with arms (e.g., wheelchair, bedside commode, etc,.)?: A Little Help needed moving to and from a bed to chair (including a wheelchair)?: A Little Help needed walking in hospital room?: A Little Help needed climbing 3-5 steps with a railing? : A Little 6 Click Score: 20    End of Session   Activity Tolerance: Patient tolerated treatment well Patient left: in chair;with call bell/phone within reach   PT Visit Diagnosis: Muscle weakness (generalized) (M62.81);Difficulty in walking, not elsewhere classified (R26.2);Pain Pain - Right/Left: Right Pain - part of body: Knee    Time: 0922-0946 PT Time Calculation (min) (ACUTE ONLY): 24 min   Charges:   PT Evaluation $PT Eval Low Complexity: 1 Low PT Treatments $Gait Training: 8-22 mins   PT G Codes:         Frank Hensley, MPT Pager: [phone]

## 2017-06-04 NOTE — Progress Notes (Signed)
Patient ID: Frank MollWilliam B Hensley, male   DOB: Aug 07, 1965, 52 y.o.   MRN: [id number] Subjective: 1 Day Post-Op Procedure(s) (LRB): RIGHT TOTAL KNEE ARTHROPLASTY AND REMOVAL OF HARDWARE (Right)    Patient reports pain as mild to moderate, doing well.  Ready to get better  Objective:   VITALS:   Vitals:   06/04/17 0216 06/04/17 0630  BP: 139/77 (!) 141/83  Pulse: 95 87  Resp: 14 20  Temp: 97.7 F (36.5 C) 97.8 F (36.6 C)  SpO2: 96% 97%    Neurovascular intact Incision: dressing C/D/I  LABS Recent Labs    06/04/17 0544  HGB 11.0*  HCT 32.8*  WBC 11.9*  PLT 192    Recent Labs    06/04/17 0544  NA 138  K 4.2  BUN 17  CREATININE 1.22  GLUCOSE 228*    No results for input(s): LABPT, INR in the last 72 hours.   Assessment/Plan: 1 Day Post-Op Procedure(s) (LRB): RIGHT TOTAL KNEE ARTHROPLASTY AND REMOVAL OF HARDWARE (Right)   Advance diet Up with therapy  Home today after therapy Outpt PT already set up Reviewed goals RTC in 2 weeks to remove dressing assess status

## 2017-06-06 ENCOUNTER — Encounter (HOSPITAL_COMMUNITY): Payer: Self-pay | Admitting: Orthopedic Surgery

## 2017-06-06 NOTE — Anesthesia Postprocedure Evaluation (Signed)
Anesthesia Post Note  Patient: Frank MollWilliam B Hensley  Procedure(Hensley) Performed: RIGHT TOTAL KNEE ARTHROPLASTY AND REMOVAL OF HARDWARE (Right Knee)     Patient location during evaluation: PACU Anesthesia Type: General Level of consciousness: awake and alert Pain management: pain level controlled Vital Signs Assessment: post-procedure vital signs reviewed and stable Respiratory status: spontaneous breathing, nonlabored ventilation, respiratory function stable and patient connected to nasal cannula oxygen Cardiovascular status: blood pressure returned to baseline and stable Postop Assessment: no apparent nausea or vomiting Anesthetic complications: no    Last Vitals:  Vitals:   06/04/17 0630 06/04/17 0955  BP: (!) 141/83 (!) 155/85  Pulse: 87 95  Resp: 20 16  Temp: 36.6 C (!) 36.4 C  SpO2: 97% 98%    Last Pain:  Vitals:   06/04/17 1354  TempSrc:   PainSc: 6                  Frank Hensley

## 2017-06-10 NOTE — Discharge Summary (Signed)
Physician Discharge Summary  Patient ID: Frank Hensley MRN: [id number] DOB/AGE: 09-10-1965 52 y.o.  Admit date: 06/03/2017 Discharge date: 06/04/2017   Procedures:  Procedure(s) (LRB): RIGHT TOTAL KNEE ARTHROPLASTY AND REMOVAL OF HARDWARE (Right)  Attending Physician:  Dr. 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 Anginal pain (HCC)    hx. of; heart attaack was ruled out (20 yrs. ago)  . Arthritis   . Diabetes mellitus without complication (HCC)    type 2  . GERD (gastroesophageal reflux disease)    food related  . Hypertension   . Sleep apnea    mild no cpap    HPI:    Frank Hensley, 51 y.o. male, has a history of pain and functional disability in the right knee due to trauma and arthritis and has failed non-surgical conservative treatments for greater than 12 weeks to include NSAID's and/or analgesics, corticosteriod injections and activity modification.  Onset of symptoms was abrupt, starting  years ago with gradually worsening course since that time. The patient noted prior procedures on the knee to include  arthroscopy and ORIF of torn ligament on the right knee(s).  Patient currently rates pain in the bilaterally knee(s) at 8 out of 10 with activity. Patient has night pain, worsening of pain with activity and weight bearing, pain that interferes with activities of daily living, pain with passive range of motion, crepitus and joint swelling.  Patient has evidence of periarticular osteophytes and joint space narrowing by imaging studies.  There is no active infection.  Risks, benefits and expectations were discussed with the patient.  Risks including but not limited to the risk of anesthesia, blood clots, nerve damage, blood vessel damage, failure of the prosthesis, infection and up to and including death.   Patient understand the risks, benefits and expectations and wishes to proceed with surgery.   PCP: Etta Quill, PA-C   Discharged Condition: good  Hospital Course:  Patient underwent the above stated procedure on 06/03/2017. Patient tolerated the procedure well and brought to the recovery room in good condition and subsequently to the floor.  POD #1 BP: 141/83 ; Pulse: 87 ; Temp: 97.8 F (36.6 C) ; Resp: 20 Patient reports pain as mild to moderate, doing well.  Ready to get better. Neurovascular intact and incision: dressing C/D/I.   LABS  Basename    HGB     11.0  HCT     32.8    Discharge Exam: General appearance: alert, cooperative and no distress Extremities: Homans sign is negative, no sign of DVT, no edema, redness or tenderness in the calves or thighs and no ulcers, gangrene or trophic changes  Disposition:  Home with follow up in 2 weeks   Follow-up Information    Durene Romans, MD. Schedule an appointment as soon as possible for a visit in 2 week(s).   Specialty:  Orthopedic Surgery Contact information: [address] [place] [address] [phone]           Discharge Instructions    Call MD / Call 911   Complete by:  As directed    If you experience chest pain or shortness of breath, CALL 911 and be transported to the hospital emergency room.  If you develope a fever above 101 F, pus (white drainage) or increased drainage  or redness at the wound, or calf pain, call your surgeon's office.   Change dressing   Complete by:  As directed    Maintain surgical dressing until follow up in the clinic. If the edges start to pull up, may reinforce with tape. If the dressing is no longer working, may remove and cover with gauze and tape, but must keep the area dry and clean.  Call with any questions or concerns.   Constipation Prevention   Complete by:  As directed    Drink plenty of fluids.  Prune juice may be helpful.  You may use a stool  softener, such as Colace (over the counter) 100 mg twice a day.  Use MiraLax (over the counter) for constipation as needed.   Diet - low sodium heart healthy   Complete by:  As directed    Discharge instructions   Complete by:  As directed    Maintain surgical dressing until follow up in the clinic. If the edges start to pull up, may reinforce with tape. If the dressing is no longer working, may remove and cover with gauze and tape, but must keep the area dry and clean.  Follow up in 2 weeks at [facility]. Call with any questions or concerns.   Increase activity slowly as tolerated   Complete by:  As directed    Weight bearing as tolerated with assist device (walker, cane, etc) as directed, use it as long as suggested by your surgeon or therapist, typically at least 4-6 weeks.   TED hose   Complete by:  As directed    Use stockings (TED hose) for 2 weeks on both leg(s).  You may remove them at night for sleeping.      Allergies as of 06/04/2017   No Known Allergies     Medication List    STOP taking these medications   aspirin EC 81 MG tablet Replaced by:  aspirin 81 MG chewable tablet   ibuprofen 600 MG tablet Commonly known as:  ADVIL,MOTRIN     TAKE these medications   acetaminophen 500 MG tablet Commonly known as:  TYLENOL Take 2 tablets (1,000 mg total) by mouth every 8 (eight) hours.   amLODipine 10 MG tablet Commonly known as:  NORVASC Take 5 mg by mouth daily.   aspirin 81 MG chewable tablet Commonly known as:  ASPIRIN CHILDRENS Chew 1 tablet (81 mg total) by mouth 2 (two) times daily. Take for 4 weeks, then resume regular dose. Replaces:  aspirin EC 81 MG tablet   atorvastatin 20 MG tablet Commonly known as:  LIPITOR Take 20 mg by mouth daily.   celecoxib 200 MG capsule Commonly known as:  CELEBREX Take 1 capsule (200 mg total) by mouth 2 (two) times daily.   docusate sodium 100 MG capsule Commonly known as:  COLACE Take 1 capsule (100 mg  total) by mouth 2 (two) times daily.   ferrous sulfate 325 (65 FE) MG tablet Commonly known as:  FERROUSUL Take 1 tablet (325 mg total) by mouth 3 (three) times daily with meals.   gabapentin 300 MG capsule Commonly known as:  NEURONTIN Take 900 mg by mouth 3 (three) times daily.   glipiZIDE 5 MG 24 hr tablet Commonly known as:  GLUCOTROL XL Take 5 mg by mouth daily.   lisinopril 20 MG tablet Commonly known as:  PRINIVIL,ZESTRIL Take 20 mg by mouth daily.   metFORMIN 1000 MG tablet Commonly known as:  GLUCOPHAGE Take 1,000 mg by  mouth 2 (two) times daily with a meal.   methocarbamol 500 MG tablet Commonly known as:  ROBAXIN Take 1 tablet (500 mg total) by mouth every 6 (six) hours as needed for muscle spasms.   oxyCODONE 5 MG immediate release tablet Commonly known as:  Oxy IR/ROXICODONE Take 1-2 tablets (5-10 mg total) by mouth every 4 (four) hours as needed for moderate pain or severe pain. What changed:    how much to take  reasons to take this   polyethylene glycol packet Commonly known as:  MIRALAX / GLYCOLAX Take 17 g by mouth 2 (two) times daily.            Discharge Care Instructions  (From admission, onward)        Start     Ordered   06/04/17 0000  Change dressing    Comments:  Maintain surgical dressing until follow up in the clinic. If the edges start to pull up, may reinforce with tape. If the dressing is no longer working, may remove and cover with gauze and tape, but must keep the area dry and clean.  Call with any questions or concerns.   06/04/17 [id number]       Signed: Anastasio AuerbachMatthew S. Jalissa Heinzelman   PA-C  06/10/2017, 12:35 PM

## 2017-06-14 ENCOUNTER — Ambulatory Visit (HOSPITAL_COMMUNITY)
Admission: RE | Admit: 2017-06-14 | Discharge: 2017-06-14 | Disposition: A | Discharge status: Home / Self Care | Payer: No Typology Code available for payment source | Source: Ambulatory Visit | Attending: Cardiovascular Disease | Admitting: Cardiovascular Disease

## 2017-06-14 ENCOUNTER — Other Ambulatory Visit: Payer: Self-pay | Admitting: Orthopedic Surgery

## 2017-06-14 ENCOUNTER — Other Ambulatory Visit (HOSPITAL_COMMUNITY): Payer: Self-pay | Admitting: Orthopedic Surgery

## 2017-06-14 DIAGNOSIS — M25471 Effusion, right ankle: Secondary | ICD-10-CM

## 2017-06-14 DIAGNOSIS — M79604 Pain in right leg: Secondary | ICD-10-CM

## 2017-06-14 DIAGNOSIS — M25571 Pain in right ankle and joints of right foot: Secondary | ICD-10-CM

## 2020-10-07 ENCOUNTER — Ambulatory Visit: Payer: Self-pay | Admitting: Orthopedic Surgery

## 2020-10-18 NOTE — Progress Notes (Addendum)
COVID Vaccine Completed:  Yes x2 Date COVID Vaccine completed: Has received booster: COVID vaccine manufacturer:    Moderna     Date of COVID positive in last 90 days:  No  PCP - Marylene Land, PA-C Cardiologist - Wonda Olds, MD notes in Care Everywhere  Chest x-ray - N/A EKG - 08-07-20 on chart Stress Test - 18 years ago ECHO - N/A Cardiac Cath - N/A Pacemaker/ICD device last checked: Spinal Cord Stimulator: Cardiac monitor - 09-09-20 Care Everywhere  Sleep Study - Yes, mild sleep apnea CPAP - No CPAP  Fasting Blood Sugar - Does not check at home Checks Blood Sugar _____ times a day  Blood Thinner Instructions: N/A Aspirin Instructions: Last Dose:  Activity level:  Can go up a flight of stairs and perform activities of daily living without stopping and without symptoms of chest pain or shortness of breath.  Patient states that he does have dyspnea at times with moderate exertion.     Anesthesia review:  Evaluated by cardiology for palpitations, dyspnea on exertion.  BP elevated at PAT 161/96.  OSA, HTN, DM  Patient denies shortness of breath, fever, cough and chest pain at PAT appointment   Patient verbalized understanding of instructions that were given to them at the PAT appointment. Patient was also instructed that they will need to review over the PAT instructions again at home before surgery.

## 2020-10-18 NOTE — Patient Instructions (Addendum)
DUE TO COVID-19 ONLY ONE VISITOR IS ALLOWED TO COME WITH YOU AND STAY IN THE WAITING ROOM ONLY DURING PRE OP AND PROCEDURE.   **NO VISITORS ARE ALLOWED IN THE SHORT STAY AREA OR RECOVERY ROOM!!**  IF YOU WILL BE ADMITTED INTO THE HOSPITAL YOU ARE ALLOWED ONLY TWO SUPPORT PEOPLE DURING VISITATION HOURS ONLY (10AM -8PM)   The support person(s) may change daily. The support person(s) must pass our screening, gel in and out, and wear a mask at all times, including in the patient's room. Patients must also wear a mask when staff or their support person are in the room.  No visitors under the age of 42. Any visitor under the age of 24 must be accompanied by an adult.    COVID SWAB TESTING MUST BE COMPLETED ON:  Wednesday 10-26-20 between the hours of 8 and 3      [address] [place] (backside of the building)  You are not required to quarantine, however you are required to wear a well-fitted mask when you are out and around people not in your household.  Hand Hygiene often Do NOT share personal items Notify your provider if you are in close contact with someone who has COVID or you develop fever 100.4 or greater, new onset of sneezing, cough, sore throat, shortness of breath or body aches.         Your procedure is scheduled on: Friday, 10-28-20   Report to [facility] Main  Entrance    Report to admitting at 11:00 AM   Call this number if you have problems the morning of surgery [phone]   Do not eat food :After Midnight.   May have liquids until 10:00 AM  day of surgery  CLEAR LIQUID DIET  Foods Allowed                                                                     Foods Excluded  Water, Black Coffee and tea, regular and decaf             liquids that you cannot  Plain Jell-O in any flavor  (No red)                                   see through such as: Fruit ices (not with fruit pulp)                                      milk, soups, orange  juice              Iced Popsicles (No red)                                      All solid food                                   Apple juices Sports drinks like Gatorade (No red) Lightly  seasoned clear broth or consume(fat free) Sugar, honey syrup      Complete one Ensure drink the morning of surgery at 10:00 AM the day of surgery.        The day of surgery:  Drink ONE (1)  G2 the morning of surgery. Drink in one sitting. Do not sip.  This drink was given to you during your hospital  pre-op appointment visit. Nothing else to drink after completing the G2.          If you have questions, please contact your surgeon's office.     Oral Hygiene is also important to reduce your risk of infection.                                    Remember - BRUSH YOUR TEETH THE MORNING OF SURGERY WITH YOUR REGULAR TOOTHPASTE   Do NOT smoke after Midnight   Take these medicines the morning of surgery with A SIP OF WATER:  Atorvastatin, Gabapentin, Pantoprazole.  Oxycodeone if needed  How to Manage Your Diabetes Before and After Surgery  Why is it important to control my blood sugar before and after surgery? Improving blood sugar levels before and after surgery helps healing and can limit problems. A way of improving blood sugar control is eating a healthy diet by:  Eating less sugar and carbohydrates  Increasing activity/exercise  Talking with your doctor about reaching your blood sugar goals High blood sugars (greater than 180 mg/dL) can raise your risk of infections and slow your recovery, so you will need to focus on controlling your diabetes during the weeks before surgery. Make sure that the doctor who takes care of your diabetes knows about your planned surgery including the date and location.  How do I manage my blood sugar before surgery? Check your blood sugar at least 4 times a day, starting 2 days before surgery, to make sure that the level is not too high or low. Check your blood  sugar the morning of your surgery when you wake up and every 2 hours until you get to the Short Stay unit. If your blood sugar is less than 70 mg/dL, you will need to treat for low blood sugar: Do not take insulin. Treat a low blood sugar (less than 70 mg/dL) with  cup of clear juice (cranberry or apple), 4 glucose tablets, OR glucose gel. Recheck blood sugar in 15 minutes after treatment (to make sure it is greater than 70 mg/dL). If your blood sugar is not greater than 70 mg/dL on recheck, call [phone] for further instructions. Report your blood sugar to the short stay nurse when you get to Short Stay.  If you are admitted to the hospital after surgery: Your blood sugar will be checked by the staff and you will probably be given insulin after surgery (instead of oral diabetes medicines) to make sure you have good blood sugar levels. The goal for blood sugar control after surgery is 80-180 mg/dL.   WHAT DO I DO ABOUT MY DIABETES MEDICATION?  Do not take oral diabetes medicines (pills) the morning of surgery.  THE DAY BEFORE SURGERY:  Take Metformin and Rybelsus as prescribed       THE MORNING OF SURGERY: Do not take take Metformin or Rybelsus.   Reviewed and Endorsed by [facility] Patient Education Committee, August 2015  You may not have any metal on your body including jewelry, and body piercing             Do not wear lotions, powders, cologne, or deodorant              Men may shave face and neck.   Do not bring valuables to the hospital. [facility] IS NOT RESPONSIBLE   FOR VALUABLES.   Contacts, dentures or bridgework may not be worn into surgery.   Bring small overnight bag day of surgery.      Please read over the following fact sheets you were given: IF YOU HAVE QUESTIONS ABOUT YOUR PRE OP INSTRUCTIONS PLEASE CALL [phone] [facility] - Preparing for Surgery Before surgery, you can play an important role.  Because skin  is not sterile, your skin needs to be as free of germs as possible.  You can reduce the number of germs on your skin by washing with CHG (chlorahexidine gluconate) soap before surgery.  CHG is an antiseptic cleaner which kills germs and bonds with the skin to continue killing germs even after washing. Please DO NOT use if you have an allergy to CHG or antibacterial soaps.  If your skin becomes reddened/irritated stop using the CHG and inform your nurse when you arrive at Short Stay. Do not shave (including legs and underarms) for at least 48 hours prior to the first CHG shower.  You may shave your face/neck.  Please follow these instructions carefully:  1.  Shower with CHG Soap the night before surgery and the  morning of surgery.  2.  If you choose to wash your hair, wash your hair first as usual with your normal  shampoo.  3.  After you shampoo, rinse your hair and body thoroughly to remove the shampoo.                             4.  Use CHG as you would any other liquid soap.  You can apply chg directly to the skin and wash.  Gently with a scrungie or clean washcloth.  5.  Apply the CHG Soap to your body ONLY FROM THE NECK DOWN.   Do   not use on face/ open                           Wound or open sores. Avoid contact with eyes, ears mouth and   genitals (private parts).                       Wash face,  Genitals (private parts) with your normal soap.             6.  Wash thoroughly, paying special attention to the area where your    surgery  will be performed.  7.  Thoroughly rinse your body with warm water from the neck down.  8.  DO NOT shower/wash with your normal soap after using and rinsing off the CHG Soap.                9.  Pat yourself dry with a clean towel.            10.  Wear clean pajamas.            11.  Place clean sheets on your bed the night of your first shower and  do not  sleep with pets. Day of Surgery : Do not apply any lotions/deodorants the morning of surgery.  Please  wear clean clothes to the hospital/surgery center.  FAILURE TO FOLLOW THESE INSTRUCTIONS MAY RESULT IN THE CANCELLATION OF YOUR SURGERY  PATIENT SIGNATURE_________________________________  NURSE SIGNATURE__________________________________  ________________________________________________________________________   Frank Hensley  An incentive spirometer is a tool that can help keep your lungs clear and active. This tool measures how well you are filling your lungs with each breath. Taking long deep breaths may help reverse or decrease the chance of developing breathing (pulmonary) problems (especially infection) following: A long period of time when you are unable to move or be active. BEFORE THE PROCEDURE  If the spirometer includes an indicator to show your best effort, your nurse or respiratory therapist will set it to a desired goal. If possible, sit up straight or lean slightly forward. Try not to slouch. Hold the incentive spirometer in an upright position. INSTRUCTIONS FOR USE  Sit on the edge of your bed if possible, or sit up as far as you can in bed or on a chair. Hold the incentive spirometer in an upright position. Breathe out normally. Place the mouthpiece in your mouth and seal your lips tightly around it. Breathe in slowly and as deeply as possible, raising the piston or the ball toward the top of the column. Hold your breath for 3-5 seconds or for as long as possible. Allow the piston or ball to fall to the bottom of the column. Remove the mouthpiece from your mouth and breathe out normally. Rest for a few seconds and repeat Steps 1 through 7 at least 10 times every 1-2 hours when you are awake. Take your time and take a few normal breaths between deep breaths. The spirometer may include an indicator to show your best effort. Use the indicator as a goal to work toward during each repetition. After each set of 10 deep breaths, practice coughing to be sure your lungs  are clear. If you have an incision (the cut made at the time of surgery), support your incision when coughing by placing a pillow or rolled up towels firmly against it. Once you are able to get out of bed, walk around indoors and cough well. You may stop using the incentive spirometer when instructed by your caregiver.  RISKS AND COMPLICATIONS Take your time so you do not get dizzy or light-headed. If you are in pain, you may need to take or ask for pain medication before doing incentive spirometry. It is harder to take a deep breath if you are having pain. AFTER USE Rest and breathe slowly and easily. It can be helpful to keep track of a log of your progress. Your caregiver can provide you with a simple table to help with this. If you are using the spirometer at home, follow these instructions: SEEK MEDICAL CARE IF:  You are having difficultly using the spirometer. You have trouble using the spirometer as often as instructed. Your pain medication is not giving enough relief while using the spirometer. You develop fever of 100.5 F (38.1 C) or higher. SEEK IMMEDIATE MEDICAL CARE IF:  You cough up bloody sputum that had not been present before. You develop fever of 102 F (38.9 C) or greater. You develop worsening pain at or near the incision site. MAKE SURE YOU:  Understand these instructions. Will watch your condition. Will get help right away if you are not doing well or get worse. Document  Released: 07/23/2006 Document Revised: 06/04/2011 Document Reviewed: 09/23/2006 [facility] Patient Information 2014 [place], [place].   ________________________________________________________________________

## 2020-10-19 ENCOUNTER — Encounter (HOSPITAL_COMMUNITY)
Admission: RE | Admit: 2020-10-19 | Discharge: 2020-10-19 | Disposition: A | Discharge status: Home / Self Care | Payer: BC Managed Care – PPO | Source: Ambulatory Visit | Attending: Specialist | Admitting: Specialist

## 2020-10-19 ENCOUNTER — Encounter (HOSPITAL_COMMUNITY): Payer: Self-pay

## 2020-10-19 ENCOUNTER — Other Ambulatory Visit: Payer: Self-pay

## 2020-10-19 DIAGNOSIS — Z01812 Encounter for preprocedural laboratory examination: Secondary | ICD-10-CM | POA: Insufficient documentation

## 2020-10-19 LAB — BASIC METABOLIC PANEL
Anion gap: 8 (ref 5–15)
BUN: 11 mg/dL (ref 6–20)
CO2: 25 mmol/L (ref 22–32)
Calcium: 9 mg/dL (ref 8.9–10.3)
Chloride: 107 mmol/L (ref 98–111)
Creatinine, Ser: 1.03 mg/dL (ref 0.61–1.24)
GFR, Estimated: >60 mL/min (ref >60)
Glucose, Bld: 195 mg/dL — ABNORMAL HIGH (ref 70–99)
Potassium: 3.9 mmol/L (ref 3.5–5.1)
Sodium: 140 mmol/L (ref 135–145)

## 2020-10-19 LAB — CBC
HCT: 40.1 % (ref 39.0–52.0)
Hemoglobin: 13.6 g/dL (ref 13.0–17.0)
MCH: 28.2 pg (ref 26.0–34.0)
MCHC: 33.9 g/dL (ref 30.0–36.0)
MCV: 83.2 fL (ref 80.0–100.0)
Platelets: 185 10*3/uL (ref 150–400)
RBC: 4.82 MIL/uL (ref 4.22–5.81)
RDW: 13.2 % (ref 11.5–15.5)
WBC: 5.7 10*3/uL (ref 4.0–10.5)
nRBC: 0 % (ref 0.0–0.2)

## 2020-10-19 LAB — GLUCOSE, CAPILLARY: Glucose-Capillary: 198 mg/dL — ABNORMAL HIGH (ref 70–99)

## 2020-10-19 NOTE — Progress Notes (Signed)
CBC sent to Dr. Beane to review. 

## 2020-10-20 ENCOUNTER — Ambulatory Visit: Payer: Self-pay | Admitting: Orthopedic Surgery

## 2020-10-20 LAB — HEMOGLOBIN A1C
Hgb A1c MFr Bld: 7.2 % — ABNORMAL HIGH (ref 4.8–5.6)
Mean Plasma Glucose: 160 mg/dL

## 2020-10-20 NOTE — H&P (View-Only) (Signed)
Frank Hensley is an 55 y.o. male.   Chief Complaint: left shoulder pain HPI: Visit For: Follow up (to discuss MRI) Location: left; shoulder Duration: 14 years Severity: pain level 6/10 Work Related: yes; The patient was placed on restrictions of no overhead work, no bending, and no prolonged sitting or standing greater than 1 hour at a time.  Past Medical History:  Diagnosis Date   Anginal pain (HCC)    hx. of; heart attaack was ruled out (20 yrs. ago)   Arthritis    Diabetes mellitus without complication (HCC)    type 2   GERD (gastroesophageal reflux disease)    food related   Hypertension    Sleep apnea    mild no cpap    Past Surgical History:  Procedure Laterality Date   APPENDECTOMY     BACK SURGERY     x2   I & D KNEE WITH POLY EXCHANGE N/A 10/25/2014   Procedure: OPEN SAPHENOUS NEURECTOMY OPEN SCAR DEBRIDEMENT AND POLY EXCHANGE;  Surgeon: Durene Romans, MD;  Location: [facility];  Service: Orthopedics;  Laterality: N/A;   JOINT REPLACEMENT     left knee   left knee arthroscopy     x 2    LUMBAR LAMINECTOMY/DECOMPRESSION MICRODISCECTOMY Left 12/30/2013   Procedure: LUMBAR DECOMPRESSION L2-3, REDO L3-4 ;  Surgeon: Javier Docker, MD;  Location: [facility];  Service: Orthopedics;  Laterality: Left;   right knee     x3 total -ligament, and cartilage x 2   right shoulder     x2   Right total knee     06-03-17 Dr. Charlann Boxer    SHOULDER SURGERY     x3   TONSILLECTOMY     TOTAL KNEE ARTHROPLASTY Right 06/03/2017   Procedure: RIGHT TOTAL KNEE ARTHROPLASTY AND REMOVAL OF HARDWARE;  Surgeon: Durene Romans, MD;  Location: [facility];  Service: Orthopedics;  Laterality: Right;  Adductor Block    No family history on file. Social History:  reports that he has never smoked. He has never used smokeless tobacco. He reports current alcohol use. He reports that he does not use drugs.  Allergies:  Allergies  Allergen Reactions   Chlorthalidone Other (See Comments)    Leg cramping and possible  elevated creatine level    (Not in a hospital admission)   Results for orders placed or performed during the hospital encounter of 10/19/20 (from the past 48 hour(s))  Glucose, capillary     Status: Abnormal   Collection Time: 10/19/20  8:05 AM  Result Value Ref Range   Glucose-Capillary 198 (H) 70 - 99 mg/dL    Comment: Glucose reference range applies only to samples taken after fasting for at least 8 hours.  CBC     Status: None   Collection Time: 10/19/20  8:25 AM  Result Value Ref Range   WBC 5.7 4.0 - 10.5 K/uL   RBC 4.82 4.22 - 5.81 MIL/uL   Hemoglobin 13.6 13.0 - 17.0 g/dL   HCT 47.4 25.9 - 56.3 %   MCV 83.2 80.0 - 100.0 fL   MCH 28.2 26.0 - 34.0 pg   MCHC 33.9 30.0 - 36.0 g/dL   RDW 87.5 64.3 - 32.9 %   Platelets 185 150 - 400 K/uL   nRBC 0.0 0.0 - 0.2 %    Comment: Performed at [facility], [address], [place], [place] [address]  Basic metabolic panel     Status: Abnormal   Collection Time: 10/19/20  8:25 AM  Result Value Ref Range   Sodium 140 135 - 145 mmol/L   Potassium 3.9 3.5 - 5.1 mmol/L   Chloride 107 98 - 111 mmol/L   CO2 25 22 - 32 mmol/L   Glucose, Bld 195 (H) 70 - 99 mg/dL    Comment: Glucose reference range applies only to samples taken after fasting for at least 8 hours.   BUN 11 6 - 20 mg/dL   Creatinine, Ser 1.61 0.61 - 1.24 mg/dL   Calcium 9.0 8.9 - 09.6 mg/dL   GFR, Estimated >04 >54 mL/min    Comment: (NOTE) Calculated using the CKD-EPI Creatinine Equation (2021)    Anion gap 8 5 - 15    Comment: Performed at [facility], [address] [place], [place] [address]  Hemoglobin A1c per protocol     Status: Abnormal   Collection Time: 10/19/20  8:25 AM  Result Value Ref Range   Hgb A1c MFr Bld 7.2 (H) 4.8 - 5.6 %    Comment: (NOTE)         Prediabetes: 5.7 - 6.4         Diabetes: >6.4         Glycemic control for adults with diabetes: <7.0    Mean Plasma Glucose 160 mg/dL    Comment:  (NOTE) Performed At: [facility] [address] [place], [place] [id number] Jolene Schimke MD OZ:[id number]    No results found.  Review of Systems  Constitutional: Negative.   HENT: Negative.    Eyes: Negative.   Respiratory: Negative.    Cardiovascular: Negative.   Gastrointestinal: Negative.   Endocrine: Negative.   Genitourinary: Negative.   Musculoskeletal:  Positive for arthralgias and myalgias.  Neurological: Negative.    There were no vitals taken for this visit. Physical Exam Constitutional:      Appearance: He is obese.  HENT:     Head: Normocephalic.     Right Ear: External ear normal.     Left Ear: External ear normal.     Nose: Nose normal.     Mouth/Throat:     Pharynx: Oropharynx is clear.  Eyes:     Conjunctiva/sclera: Conjunctivae normal.  Cardiovascular:     Rate and Rhythm: Normal rate and regular rhythm.     Pulses: Normal pulses.  Pulmonary:     Effort: Pulmonary effort is normal.  Abdominal:     General: Bowel sounds are normal.  Musculoskeletal:     Cervical back: Normal range of motion.     Comments: Tender anterior subacromial region. Positive impingement sign positive impingement sign. Weak in abduction. Weak and internal rotation. Nontender over the [facility] joint. No significant anterior shoulder pain on the biceps tendon   Skin:    General: Skin is warm and dry.  Neurological:     Mental Status: He is alert.    MRI of the left shoulder demonstrates complete retracted tear of the supraspinatus tendon with mild muscle atrophy. A near complete tear of the subscapularis tendon with moderate to severe muscle atrophy. Calcific tendinitis of the infraspinatus. Hypertrophic AC joint. 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 Tear of the labrum is noted as well. Severe tendinosis of the biceps tendon  Assessment/Plan Impression:  Patient demonstrates left shoulder pain secondary to a now full-thickness retracted tear of the  supraspinatus with mild supraspinatus atrophy. There is near complete tear of the subscap. Impingement type syndrome.  Underlying diabetes.  Plan:  We discussed options at  this point in time given the full-thickness retracted tear I feel proceeding with a rotator cuff repair would be appropriate. In addition to determine whether there would be any opportunity to repair the subscapularis tendon. In addition to debride the labrum. An extensive discussion concerning the pathology relevant anatomy and treatment options. After that discussion we mutually agreed to proceed with repair of the rotator cuff utilizing arthroscopic assistance if possible. The risks and benefits of that procedure were discussed including bleeding, infection, suboptimal range of motion, deep venous thrombosis, pulmonary embolism, anesthetic complications etc. in addition we discussed the postoperative course to include approximately 4 weeks of passive range of motion followed by 4 weeks of active range of motion followed by 4-12 weeks of progressive strengthening exercises. In addition we discussed protective activities to reduce the risk of a reinjury including impingement activities with elbow above the shoulder as well as reaching and repetitive circular motion activities. The hospital stay will either be as a outpatient with a regional block versus overnight depending upon the extent of the procedure and any challenging health issues with a first postoperative visit 2 weeks following the surgery.  We also discussed the probability of requiring a patch graft given his retracted tear duration of symptoms as well as his underlying diabetes.  We also talked about the inability to repair the tendons that he may require a shoulder replacement at some point in time.  Plan Left shoulder scope, SAD, mini-open RCR, possible patch graft, possible biceps tenotomy vs tenodesis  Dorothy Spark, PA-C for Dr Shelle Iron 10/20/2020, 1:05  PM

## 2020-10-20 NOTE — H&P (Signed)
Frank Hensley is an 55 y.o. male.   Chief Complaint: left shoulder pain HPI: Visit For: Follow up (to discuss MRI) Location: left; shoulder Duration: 14 years Severity: pain level 6/10 Work Related: yes; The patient was placed on restrictions of no overhead work, no bending, and no prolonged sitting or standing greater than 1 hour at a time.  Past Medical History:  Diagnosis Date   Anginal pain (HCC)    hx. of; heart attaack was ruled out (20 yrs. ago)   Arthritis    Diabetes mellitus without complication (HCC)    type 2   GERD (gastroesophageal reflux disease)    food related   Hypertension    Sleep apnea    mild no cpap    Past Surgical History:  Procedure Laterality Date   APPENDECTOMY     BACK SURGERY     x2   I & D KNEE WITH POLY EXCHANGE N/A 10/25/2014   Procedure: OPEN SAPHENOUS NEURECTOMY OPEN SCAR DEBRIDEMENT AND POLY EXCHANGE;  Surgeon: Durene Romans, MD;  Location: [facility];  Service: Orthopedics;  Laterality: N/A;   JOINT REPLACEMENT     left knee   left knee arthroscopy     x 2    LUMBAR LAMINECTOMY/DECOMPRESSION MICRODISCECTOMY Left 12/30/2013   Procedure: LUMBAR DECOMPRESSION L2-3, REDO L3-4 ;  Surgeon: Javier Docker, MD;  Location: [facility];  Service: Orthopedics;  Laterality: Left;   right knee     x3 total -ligament, and cartilage x 2   right shoulder     x2   Right total knee     06-03-17 Dr. Charlann Boxer    SHOULDER SURGERY     x3   TONSILLECTOMY     TOTAL KNEE ARTHROPLASTY Right 06/03/2017   Procedure: RIGHT TOTAL KNEE ARTHROPLASTY AND REMOVAL OF HARDWARE;  Surgeon: Durene Romans, MD;  Location: [facility];  Service: Orthopedics;  Laterality: Right;  Adductor Block    No family history on file. Social History:  reports that he has never smoked. He has never used smokeless tobacco. He reports current alcohol use. He reports that he does not use drugs.  Allergies:  Allergies  Allergen Reactions   Chlorthalidone Other (See Comments)    Leg cramping and possible  elevated creatine level    (Not in a hospital admission)   Results for orders placed or performed during the hospital encounter of 10/19/20 (from the past 48 hour(s))  Glucose, capillary     Status: Abnormal   Collection Time: 10/19/20  8:05 AM  Result Value Ref Range   Glucose-Capillary 198 (H) 70 - 99 mg/dL    Comment: Glucose reference range applies only to samples taken after fasting for at least 8 hours.  CBC     Status: None   Collection Time: 10/19/20  8:25 AM  Result Value Ref Range   WBC 5.7 4.0 - 10.5 K/uL   RBC 4.82 4.22 - 5.81 MIL/uL   Hemoglobin 13.6 13.0 - 17.0 g/dL   HCT 47.4 25.9 - 56.3 %   MCV 83.2 80.0 - 100.0 fL   MCH 28.2 26.0 - 34.0 pg   MCHC 33.9 30.0 - 36.0 g/dL   RDW 87.5 64.3 - 32.9 %   Platelets 185 150 - 400 K/uL   nRBC 0.0 0.0 - 0.2 %    Comment: Performed at [facility], [address], [place], [place] [address]  Basic metabolic panel     Status: Abnormal   Collection Time: 10/19/20  8:25 AM  Result Value Ref Range   Sodium 140 135 - 145 mmol/L   Potassium 3.9 3.5 - 5.1 mmol/L   Chloride 107 98 - 111 mmol/L   CO2 25 22 - 32 mmol/L   Glucose, Bld 195 (H) 70 - 99 mg/dL    Comment: Glucose reference range applies only to samples taken after fasting for at least 8 hours.   BUN 11 6 - 20 mg/dL   Creatinine, Ser 1.61 0.61 - 1.24 mg/dL   Calcium 9.0 8.9 - 09.6 mg/dL   GFR, Estimated >04 >54 mL/min    Comment: (NOTE) Calculated using the CKD-EPI Creatinine Equation (2021)    Anion gap 8 5 - 15    Comment: Performed at [facility], [address] [place], [place] [address]  Hemoglobin A1c per protocol     Status: Abnormal   Collection Time: 10/19/20  8:25 AM  Result Value Ref Range   Hgb A1c MFr Bld 7.2 (H) 4.8 - 5.6 %    Comment: (NOTE)         Prediabetes: 5.7 - 6.4         Diabetes: >6.4         Glycemic control for adults with diabetes: <7.0    Mean Plasma Glucose 160 mg/dL    Comment:  (NOTE) Performed At: [facility] [address] [place], [place] [id number] Jolene Schimke MD OZ:[id number]    No results found.  Review of Systems  Constitutional: Negative.   HENT: Negative.    Eyes: Negative.   Respiratory: Negative.    Cardiovascular: Negative.   Gastrointestinal: Negative.   Endocrine: Negative.   Genitourinary: Negative.   Musculoskeletal:  Positive for arthralgias and myalgias.  Neurological: Negative.    There were no vitals taken for this visit. Physical Exam Constitutional:      Appearance: He is obese.  HENT:     Head: Normocephalic.     Right Ear: External ear normal.     Left Ear: External ear normal.     Nose: Nose normal.     Mouth/Throat:     Pharynx: Oropharynx is clear.  Eyes:     Conjunctiva/sclera: Conjunctivae normal.  Cardiovascular:     Rate and Rhythm: Normal rate and regular rhythm.     Pulses: Normal pulses.  Pulmonary:     Effort: Pulmonary effort is normal.  Abdominal:     General: Bowel sounds are normal.  Musculoskeletal:     Cervical back: Normal range of motion.     Comments: Tender anterior subacromial region. Positive impingement sign positive impingement sign. Weak in abduction. Weak and internal rotation. Nontender over the [facility] joint. No significant anterior shoulder pain on the biceps tendon   Skin:    General: Skin is warm and dry.  Neurological:     Mental Status: He is alert.    MRI of the left shoulder demonstrates complete retracted tear of the supraspinatus tendon with mild muscle atrophy. A near complete tear of the subscapularis tendon with moderate to severe muscle atrophy. Calcific tendinitis of the infraspinatus. Hypertrophic AC joint. 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 Tear of the labrum is noted as well. Severe tendinosis of the biceps tendon  Assessment/Plan Impression:  Patient demonstrates left shoulder pain secondary to a now full-thickness retracted tear of the  supraspinatus with mild supraspinatus atrophy. There is near complete tear of the subscap. Impingement type syndrome.  Underlying diabetes.  Plan:  We discussed options at  this point in time given the full-thickness retracted tear I feel proceeding with a rotator cuff repair would be appropriate. In addition to determine whether there would be any opportunity to repair the subscapularis tendon. In addition to debride the labrum. An extensive discussion concerning the pathology relevant anatomy and treatment options. After that discussion we mutually agreed to proceed with repair of the rotator cuff utilizing arthroscopic assistance if possible. The risks and benefits of that procedure were discussed including bleeding, infection, suboptimal range of motion, deep venous thrombosis, pulmonary embolism, anesthetic complications etc. in addition we discussed the postoperative course to include approximately 4 weeks of passive range of motion followed by 4 weeks of active range of motion followed by 4-12 weeks of progressive strengthening exercises. In addition we discussed protective activities to reduce the risk of a reinjury including impingement activities with elbow above the shoulder as well as reaching and repetitive circular motion activities. The hospital stay will either be as a outpatient with a regional block versus overnight depending upon the extent of the procedure and any challenging health issues with a first postoperative visit 2 weeks following the surgery.  We also discussed the probability of requiring a patch graft given his retracted tear duration of symptoms as well as his underlying diabetes.  We also talked about the inability to repair the tendons that he may require a shoulder replacement at some point in time.  Plan Left shoulder scope, SAD, mini-open RCR, possible patch graft, possible biceps tenotomy vs tenodesis  Dorothy Spark, PA-C for Dr Shelle Iron 10/20/2020, 1:05  PM

## 2020-10-26 ENCOUNTER — Other Ambulatory Visit: Payer: Self-pay | Admitting: Specialist

## 2020-10-26 LAB — SARS CORONAVIRUS 2 (TAT 6-24 HRS): SARS Coronavirus 2: NEGATIVE

## 2020-10-28 ENCOUNTER — Encounter (HOSPITAL_COMMUNITY)
Admission: RE | Disposition: A | Discharge status: Home / Self Care | Payer: Self-pay | Source: Ambulatory Visit | Attending: Specialist

## 2020-10-28 ENCOUNTER — Ambulatory Visit (HOSPITAL_COMMUNITY): Payer: No Typology Code available for payment source | Admitting: Anesthesiology

## 2020-10-28 ENCOUNTER — Ambulatory Visit (HOSPITAL_COMMUNITY): Payer: No Typology Code available for payment source | Admitting: Physician Assistant

## 2020-10-28 ENCOUNTER — Ambulatory Visit (HOSPITAL_COMMUNITY)
Admission: RE | Admit: 2020-10-28 | Discharge: 2020-10-28 | Disposition: A | Discharge status: Home / Self Care | Payer: No Typology Code available for payment source | Source: Ambulatory Visit | Attending: Specialist | Admitting: Specialist

## 2020-10-28 ENCOUNTER — Encounter (HOSPITAL_COMMUNITY): Payer: Self-pay | Admitting: Specialist

## 2020-10-28 DIAGNOSIS — X58XXXA Exposure to other specified factors, initial encounter: Secondary | ICD-10-CM | POA: Insufficient documentation

## 2020-10-28 DIAGNOSIS — E119 Type 2 diabetes mellitus without complications: Secondary | ICD-10-CM | POA: Insufficient documentation

## 2020-10-28 DIAGNOSIS — M75112 Incomplete rotator cuff tear or rupture of left shoulder, not specified as traumatic: Secondary | ICD-10-CM | POA: Insufficient documentation

## 2020-10-28 DIAGNOSIS — Z7984 Long term (current) use of oral hypoglycemic drugs: Secondary | ICD-10-CM | POA: Diagnosis not present

## 2020-10-28 DIAGNOSIS — Y939 Activity, unspecified: Secondary | ICD-10-CM | POA: Insufficient documentation

## 2020-10-28 DIAGNOSIS — Z888 Allergy status to other drugs, medicaments and biological substances status: Secondary | ICD-10-CM | POA: Insufficient documentation

## 2020-10-28 DIAGNOSIS — M7542 Impingement syndrome of left shoulder: Secondary | ICD-10-CM | POA: Insufficient documentation

## 2020-10-28 DIAGNOSIS — S46812A Strain of other muscles, fascia and tendons at shoulder and upper arm level, left arm, initial encounter: Secondary | ICD-10-CM | POA: Diagnosis not present

## 2020-10-28 HISTORY — PX: SHOULDER ARTHROSCOPY WITH ROTATOR CUFF REPAIR AND SUBACROMIAL DECOMPRESSION: SHX5686

## 2020-10-28 LAB — GLUCOSE, CAPILLARY
Glucose-Capillary: 178 mg/dL — ABNORMAL HIGH (ref 70–99)
Glucose-Capillary: 179 mg/dL — ABNORMAL HIGH (ref 70–99)

## 2020-10-28 SURGERY — SHOULDER ARTHROSCOPY WITH ROTATOR CUFF REPAIR AND SUBACROMIAL DECOMPRESSION
Anesthesia: General | Site: Shoulder | Laterality: Left

## 2020-10-28 MED ORDER — OXYCODONE HCL 5 MG/5ML PO SOLN
5.0000 mg | Freq: Once | ORAL | Status: DC | PRN
Start: 2020-10-28 — End: 2020-10-28

## 2020-10-28 MED ORDER — CEPHALEXIN 500 MG PO CAPS: 500.0000 mg | ORAL_CAPSULE | Freq: Four times a day (QID) | ORAL | 1 refills | Status: AC

## 2020-10-28 MED ORDER — OXYCODONE HCL 10 MG PO TABS: 10.0000 mg | ORAL_TABLET | ORAL | 0 refills | Status: AC | PRN

## 2020-10-28 MED ORDER — DEXAMETHASONE SODIUM PHOSPHATE 10 MG/ML IJ SOLN
INTRAMUSCULAR | Status: DC | PRN
Start: 2020-10-28 — End: 2020-10-28
  Administered 2020-10-28: 4 mg via INTRAVENOUS

## 2020-10-28 MED ORDER — CHLORHEXIDINE GLUCONATE 0.12 % MT SOLN
15.0000 mL | Freq: Once | OROMUCOSAL | Status: AC
  Administered 2020-10-28: 15 mL via OROMUCOSAL

## 2020-10-28 MED ORDER — PHENYLEPHRINE HCL (PRESSORS) 10 MG/ML IV SOLN
INTRAVENOUS | Status: AC
  Filled 2020-10-28: qty 2

## 2020-10-28 MED ORDER — SUGAMMADEX SODIUM 200 MG/2ML IV SOLN
INTRAVENOUS | Status: DC | PRN
  Administered 2020-10-28: 200 mg via INTRAVENOUS

## 2020-10-28 MED ORDER — ONDANSETRON HCL 4 MG/2ML IJ SOLN: 4.0000 mg | Freq: Once | INTRAMUSCULAR | Status: DC | PRN

## 2020-10-28 MED ORDER — PROPOFOL 10 MG/ML IV BOLUS
INTRAVENOUS | Status: DC | PRN
  Administered 2020-10-28: 200 mg via INTRAVENOUS

## 2020-10-28 MED ORDER — MEPERIDINE HCL 50 MG/ML IJ SOLN: 6.2500 mg | INTRAMUSCULAR | Status: DC | PRN

## 2020-10-28 MED ORDER — 0.9 % SODIUM CHLORIDE (POUR BTL) OPTIME
TOPICAL | Status: DC | PRN
  Administered 2020-10-28: 1000 mL

## 2020-10-28 MED ORDER — FENTANYL CITRATE (PF) 100 MCG/2ML IJ SOLN
50.0000 ug | INTRAMUSCULAR | Status: DC
  Administered 2020-10-28: 100 ug via INTRAVENOUS
  Filled 2020-10-28: qty 2

## 2020-10-28 MED ORDER — BUPIVACAINE LIPOSOME 1.3 % IJ SUSP
INTRAMUSCULAR | Status: DC | PRN
  Administered 2020-10-28: 10 mL via PERINEURAL

## 2020-10-28 MED ORDER — EPHEDRINE SULFATE-NACL 50-0.9 MG/10ML-% IV SOSY
PREFILLED_SYRINGE | INTRAVENOUS | Status: DC | PRN
  Administered 2020-10-28 (×2): 5 mg via INTRAVENOUS

## 2020-10-28 MED ORDER — CEFAZOLIN IN SODIUM CHLORIDE 3-0.9 GM/100ML-% IV SOLN
3.0000 g | INTRAVENOUS | Status: AC
  Administered 2020-10-28: 3 g via INTRAVENOUS
  Filled 2020-10-28: qty 100

## 2020-10-28 MED ORDER — ONDANSETRON HCL 4 MG/2ML IJ SOLN
INTRAMUSCULAR | Status: AC
  Filled 2020-10-28: qty 2

## 2020-10-28 MED ORDER — LACTATED RINGERS IV SOLN: INTRAVENOUS | Status: DC

## 2020-10-28 MED ORDER — ORAL CARE MOUTH RINSE: 15.0000 mL | Freq: Once | OROMUCOSAL | Status: AC

## 2020-10-28 MED ORDER — DEXAMETHASONE SODIUM PHOSPHATE 10 MG/ML IJ SOLN
INTRAMUSCULAR | Status: AC
  Filled 2020-10-28: qty 1

## 2020-10-28 MED ORDER — LIDOCAINE 2% (20 MG/ML) 5 ML SYRINGE
INTRAMUSCULAR | Status: AC
  Filled 2020-10-28: qty 5

## 2020-10-28 MED ORDER — LIDOCAINE 2% (20 MG/ML) 5 ML SYRINGE
INTRAMUSCULAR | Status: DC | PRN
  Administered 2020-10-28: 100 mg via INTRAVENOUS

## 2020-10-28 MED ORDER — OXYCODONE HCL 5 MG PO TABS: 5.0000 mg | ORAL_TABLET | Freq: Once | ORAL | Status: DC | PRN

## 2020-10-28 MED ORDER — FENTANYL CITRATE (PF) 100 MCG/2ML IJ SOLN
INTRAMUSCULAR | Status: DC | PRN
  Administered 2020-10-28: 50 ug via INTRAVENOUS

## 2020-10-28 MED ORDER — EPINEPHRINE PF 1 MG/ML IJ SOLN
INTRAMUSCULAR | Status: AC
  Filled 2020-10-28: qty 2

## 2020-10-28 MED ORDER — ROCURONIUM BROMIDE 10 MG/ML (PF) SYRINGE
PREFILLED_SYRINGE | INTRAVENOUS | Status: AC
  Filled 2020-10-28: qty 10

## 2020-10-28 MED ORDER — LACTATED RINGERS IR SOLN
Status: DC | PRN
  Administered 2020-10-28: 6000 mL

## 2020-10-28 MED ORDER — BUPIVACAINE-EPINEPHRINE (PF) 0.5% -1:200000 IJ SOLN
INTRAMUSCULAR | Status: AC
  Filled 2020-10-28: qty 30

## 2020-10-28 MED ORDER — MIDAZOLAM HCL 2 MG/2ML IJ SOLN
1.0000 mg | INTRAMUSCULAR | Status: DC
  Administered 2020-10-28: 2 mg via INTRAVENOUS
  Filled 2020-10-28: qty 2

## 2020-10-28 MED ORDER — ONDANSETRON HCL 4 MG/2ML IJ SOLN
INTRAMUSCULAR | Status: DC | PRN
  Administered 2020-10-28: 4 mg via INTRAVENOUS

## 2020-10-28 MED ORDER — PHENYLEPHRINE HCL-NACL 20-0.9 MG/250ML-% IV SOLN
INTRAVENOUS | Status: DC | PRN
  Administered 2020-10-28: 25 ug/min via INTRAVENOUS

## 2020-10-28 MED ORDER — PROPOFOL 10 MG/ML IV BOLUS
INTRAVENOUS | Status: AC
  Filled 2020-10-28: qty 40

## 2020-10-28 MED ORDER — DOCUSATE SODIUM 100 MG PO CAPS: 100.0000 mg | ORAL_CAPSULE | Freq: Two times a day (BID) | ORAL | 1 refills | Status: AC | PRN

## 2020-10-28 MED ORDER — BUPIVACAINE HCL (PF) 0.5 % IJ SOLN
INTRAMUSCULAR | Status: DC | PRN
  Administered 2020-10-28: 15 mL via PERINEURAL

## 2020-10-28 MED ORDER — BUPIVACAINE-EPINEPHRINE 0.5% -1:200000 IJ SOLN
INTRAMUSCULAR | Status: DC | PRN
  Administered 2020-10-28: 18 mL

## 2020-10-28 MED ORDER — EPHEDRINE 5 MG/ML INJ
INTRAVENOUS | Status: AC
  Filled 2020-10-28: qty 5

## 2020-10-28 MED ORDER — METHOCARBAMOL 500 MG PO TABS: 500.0000 mg | ORAL_TABLET | Freq: Three times a day (TID) | ORAL | 1 refills | Status: AC | PRN

## 2020-10-28 MED ORDER — ACETAMINOPHEN 325 MG PO TABS: 325.0000 mg | ORAL_TABLET | ORAL | Status: DC | PRN

## 2020-10-28 MED ORDER — ACETAMINOPHEN 160 MG/5ML PO SOLN: 325.0000 mg | ORAL | Status: DC | PRN

## 2020-10-28 MED ORDER — FENTANYL CITRATE (PF) 100 MCG/2ML IJ SOLN
INTRAMUSCULAR | Status: AC
  Filled 2020-10-28: qty 2

## 2020-10-28 MED ORDER — FENTANYL CITRATE (PF) 100 MCG/2ML IJ SOLN: 25.0000 ug | INTRAMUSCULAR | Status: DC | PRN

## 2020-10-28 MED ORDER — ROCURONIUM BROMIDE 10 MG/ML (PF) SYRINGE
PREFILLED_SYRINGE | INTRAVENOUS | Status: DC | PRN
  Administered 2020-10-28: 70 mg via INTRAVENOUS

## 2020-10-28 MED ORDER — EPINEPHRINE PF 1 MG/ML IJ SOLN
INTRAMUSCULAR | Status: DC | PRN
  Administered 2020-10-28: 2 mg

## 2020-10-28 SURGICAL SUPPLY — 73 items
AID PSTN UNV HD RSTRNT DISP (MISCELLANEOUS)
ANCH SUT 2 19.1 W/TIGERTAPE (Anchor) ×2 IMPLANT
ANCH SUT SWLK 19.1X4.75 (Anchor) ×2 IMPLANT
ANCHOR NDL 9/16 CIR SZ 8 (NEEDLE) IMPLANT
ANCHOR NEEDLE 9/16 CIR SZ 8 (NEEDLE) IMPLANT
ANCHOR SL BIO 4.75 W/TIGERTAPE (Anchor) ×2 IMPLANT
ANCHOR SUT BIO SW 4.75X19.1 (Anchor) ×4 IMPLANT
BAG COUNTER SPONGE SURGICOUNT (BAG) IMPLANT
BAG SPNG CNTER NS LX DISP (BAG)
BLADE EXCALIBUR 4.0X13 (MISCELLANEOUS) ×1 IMPLANT
BLADE SURG SZ11 CARB STEEL (BLADE) ×2 IMPLANT
CANNULA ACUFO 5X76 (CANNULA) ×2 IMPLANT
CLEANER TIP ELECTROSURG 2X2 (MISCELLANEOUS) IMPLANT
COVER SURGICAL LIGHT HANDLE (MISCELLANEOUS) ×2 IMPLANT
DISSECTOR  3.8MM X 13CM (MISCELLANEOUS)
DISSECTOR 3.5MM X 13CM (MISCELLANEOUS) IMPLANT
DISSECTOR 3.8MM X 13CM (MISCELLANEOUS) IMPLANT
DRAPE POUCH INSTRU U-SHP 10X18 (DRAPES) ×2 IMPLANT
DRAPE STERI 35X30 U-POUCH (DRAPES) ×2 IMPLANT
DRESSING AQUACEL AG SP 3.5X6 (GAUZE/BANDAGES/DRESSINGS) IMPLANT
DRSG AQUACEL AG SP 3.5X6 (GAUZE/BANDAGES/DRESSINGS) ×2
DRSG PAD ABDOMINAL 8X10 ST (GAUZE/BANDAGES/DRESSINGS) IMPLANT
DURAPREP 26ML APPLICATOR (WOUND CARE) ×2 IMPLANT
ELECT NDL TIP 2.8 STRL (NEEDLE) ×1 IMPLANT
ELECT NEEDLE TIP 2.8 STRL (NEEDLE) ×2 IMPLANT
ELECT REM PT RETURN 15FT ADLT (MISCELLANEOUS) ×2 IMPLANT
FILTER STRAW (MISCELLANEOUS) ×2 IMPLANT
GLOVE SRG 8 PF TXTR STRL LF DI (GLOVE) ×1 IMPLANT
GLOVE SURG POLYISO LF SZ7.5 (GLOVE) ×4 IMPLANT
GLOVE SURG POLYISO LF SZ8 (GLOVE) ×4 IMPLANT
GLOVE SURG UNDER POLY LF SZ7.5 (GLOVE) ×2 IMPLANT
GLOVE SURG UNDER POLY LF SZ8 (GLOVE) ×2
GOWN STRL REUS W/TWL XL LVL3 (GOWN DISPOSABLE) ×4 IMPLANT
KIT BASIN OR (CUSTOM PROCEDURE TRAY) ×2 IMPLANT
KIT TURNOVER KIT A (KITS) ×2 IMPLANT
MANIFOLD NEPTUNE II (INSTRUMENTS) ×2 IMPLANT
NDL SCORPION MULTI FIRE (NEEDLE) IMPLANT
NDL SPNL 18GX3.5 QUINCKE PK (NEEDLE) ×1 IMPLANT
NEEDLE SCORPION MULTI FIRE (NEEDLE) ×2 IMPLANT
NEEDLE SPNL 18GX3.5 QUINCKE PK (NEEDLE) ×2 IMPLANT
PACK SHOULDER (CUSTOM PROCEDURE TRAY) ×2 IMPLANT
PENCIL SMOKE EVACUATOR (MISCELLANEOUS) IMPLANT
PORT APPOLLO RF 90DEGREE MULTI (SURGICAL WAND) ×2 IMPLANT
PROTECTOR NERVE ULNAR (MISCELLANEOUS) ×2 IMPLANT
RESTRAINT HEAD UNIVERSAL NS (MISCELLANEOUS) IMPLANT
SLING ARM IMMOBILIZER LRG (SOFTGOODS) ×1 IMPLANT
SLING ARM IMMOBILIZER MED (SOFTGOODS) IMPLANT
SLING ULTRA II L (ORTHOPEDIC SUPPLIES) IMPLANT
SPONGE T-LAP 18X18 ~~LOC~~+RFID (SPONGE) ×1 IMPLANT
SPONGE T-LAP 4X18 ~~LOC~~+RFID (SPONGE) ×4 IMPLANT
STRIP CLOSURE SKIN 1/2X4 (GAUZE/BANDAGES/DRESSINGS) ×1 IMPLANT
SUCTION FRAZIER HANDLE 12FR (TUBING) ×2
SUCTION TUBE FRAZIER 12FR DISP (TUBING) ×1 IMPLANT
SUT ETHIBOND NAB CT1 #1 30IN (SUTURE) IMPLANT
SUT ETHILON 4 0 PS 2 18 (SUTURE) ×2 IMPLANT
SUT FIBERWIRE #2 38 T-5 BLUE (SUTURE)
SUT MNCRL AB 3-0 PS2 18 (SUTURE) ×1 IMPLANT
SUT PROLENE 3 0 PS 2 (SUTURE) ×2 IMPLANT
SUT TIGER TAPE 7 IN WHITE (SUTURE) IMPLANT
SUT VIC AB 0 CT1 27 (SUTURE)
SUT VIC AB 0 CT1 27XBRD ANTBC (SUTURE) ×1 IMPLANT
SUT VIC AB 1-0 CT2 27 (SUTURE) IMPLANT
SUT VIC AB 2-0 CT2 27 (SUTURE) IMPLANT
SUT VIC AB 2-0 SH 27 (SUTURE) ×2
SUT VIC AB 2-0 SH 27XBRD (SUTURE) IMPLANT
SUT VICRYL 0 UR6 27IN ABS (SUTURE) ×3 IMPLANT
SUTURE FIBERWR #2 38 T-5 BLUE (SUTURE) IMPLANT
SYR 20ML LL LF (SYRINGE) ×2 IMPLANT
TAPE FIBER 2MM 7IN #2 BLUE (SUTURE) IMPLANT
TOWEL OR 17X26 10 PK STRL BLUE (TOWEL DISPOSABLE) ×2 IMPLANT
TUBING ARTHROSCOPY IRRIG 16FT (MISCELLANEOUS) ×2 IMPLANT
TUBING CONNECTING 10 (TUBING) ×4 IMPLANT
WIPE CHG CHLORHEXIDINE 2% (PERSONAL CARE ITEMS) IMPLANT

## 2020-10-28 NOTE — Anesthesia Preprocedure Evaluation (Signed)
Anesthesia Evaluation  Patient identified by MRN, date of birth, ID band Patient awake    Reviewed: Allergy & Precautions, H&P , NPO status , Patient's Chart, lab work & pertinent test results  Airway Mallampati: II   Neck ROM: full    Dental   Pulmonary sleep apnea ,    breath sounds clear to auscultation       Cardiovascular hypertension,  Rhythm:regular Rate:Normal     Neuro/Psych    GI/Hepatic GERD  Medicated,  Endo/Other  diabetes, Type 2, Oral Hypoglycemic Agents  Renal/GU      Musculoskeletal  (+) Arthritis ,   Abdominal   Peds  Hematology   Anesthesia Other Findings   Reproductive/Obstetrics                             Anesthesia Physical  Anesthesia Plan  ASA: 3  Anesthesia Plan: General   Post-op Pain Management: GA combined w/ Regional for post-op pain   Induction: Intravenous  PONV Risk Score and Plan: 1 and Ondansetron, Midazolam and Treatment may vary due to age or medical condition  Airway Management Planned: Oral ETT and LMA  Additional Equipment: None  Intra-op Plan:   Post-operative Plan: Extubation in OR  Informed Consent: I have reviewed the patients History and Physical, chart, labs and discussed the procedure including the risks, benefits and alternatives for the proposed anesthesia with the patient or authorized representative who has indicated his/her understanding and acceptance.     Dental advisory given  Plan Discussed with: CRNA, Anesthesiologist and Surgeon  Anesthesia Plan Comments: (Discussed both nerve block for pain relief post-op and GA; including NV, sore throat, dental injury, and pulmonary complications)        Anesthesia Quick Evaluation

## 2020-10-28 NOTE — Discharge Instructions (Signed)
Aquacel dressing may remain in place until follow up. May shower with aquacel dressing in place. If the dressing becomes saturated or peels off, you may remove it and place a new dressing with gauze and tape which should be kept clean and dry and changed daily. °Use sling at times except when exercising or showering °No driving for 4-6 weeks °No lifting for 6 weeks operative arm °Pendulum exercises as instructed. °Ok to move wrist, elbow, and hand. °See Dr. Kema Santaella in 10-14 days. Take one aspirin per day with a meal if not on a blood thinner or allergic to aspirin. °

## 2020-10-28 NOTE — Anesthesia Postprocedure Evaluation (Signed)
Anesthesia Post Note  Patient: Frank Hensley  Procedure(s) Performed: SHOULDER ARTHROSCOPY,  SUBACROMIAL DECOMPRESSION, MINI OPEN ROTATOR CUFF REPAIR (Left: Shoulder)     Patient location during evaluation: PACU Anesthesia Type: General Level of consciousness: awake and alert Pain management: pain level controlled Vital Signs Assessment: post-procedure vital signs reviewed and stable Respiratory status: spontaneous breathing, nonlabored ventilation, respiratory function stable and patient connected to nasal cannula oxygen Cardiovascular status: blood pressure returned to baseline and stable Postop Assessment: no apparent nausea or vomiting Anesthetic complications: no   No notable events documented.  Last Vitals:  Vitals:   10/28/20 1652 10/28/20 1700  BP:    Pulse: 100 98  Resp: 10 15  Temp:    SpO2: 97% 95%    Last Pain:  Vitals:   10/28/20 1650  PainSc: 0-No pain                 Tameika Heckmann

## 2020-10-28 NOTE — Anesthesia Procedure Notes (Signed)
Procedure Name: Intubation Date/Time: 10/28/2020 1:12 PM Performed by: Victoriano Lain, CRNA Pre-anesthesia Checklist: Patient identified, Emergency Drugs available, Suction available, Patient being monitored and Timeout performed Patient Re-evaluated:Patient Re-evaluated prior to induction Oxygen Delivery Method: Circle system utilized Preoxygenation: Pre-oxygenation with 100% oxygen Induction Type: IV induction Ventilation: Mask ventilation without difficulty Laryngoscope Size: Mac and 4 Grade View: Grade I Tube type: Oral Tube size: 7.5 mm Number of attempts: 1 Airway Equipment and Method: Stylet Placement Confirmation: ETT inserted through vocal cords under direct vision, CO2 detector and breath sounds checked- equal and bilateral Secured at: 23 cm Tube secured with: Tape Dental Injury: Teeth and Oropharynx as per pre-operative assessment

## 2020-10-28 NOTE — Transfer of Care (Signed)
Immediate Anesthesia Transfer of Care Note  Patient: Frank Hensley  Procedure(s) Performed: SHOULDER ARTHROSCOPY,  SUBACROMIAL DECOMPRESSION, MINI OPEN ROTATOR CUFF REPAIR (Left: Shoulder)  Patient Location: PACU  Anesthesia Type:General  Level of Consciousness: awake  Airway & Oxygen Therapy: Patient Spontanous Breathing and Patient connected to face mask oxygen  Post-op Assessment: Report given to RN and Post -op Vital signs reviewed and stable  Post vital signs: Reviewed and stable  Last Vitals:  Vitals Value Taken Time  BP    Temp    Pulse 112 10/28/20 1606  Resp 20 10/28/20 1606  SpO2 100 % 10/28/20 1606  Vitals shown include unvalidated device data.  Last Pain: There were no vitals filed for this visit.    Patients Stated Pain Goal: 3 (10/28/20 1051)  Complications: No notable events documented.

## 2020-10-28 NOTE — Progress Notes (Signed)
Assisted Dr. Oddono with left, ultrasound guided, interscalene  block. Side rails up, monitors on throughout procedure. See vital signs in flow sheet. Tolerated Procedure well. 

## 2020-10-28 NOTE — Anesthesia Procedure Notes (Addendum)
  Anesthesia Regional Block: Interscalene brachial plexus block   Pre-Anesthetic Checklist: , timeout performed,  Correct Patient, Correct Site, Correct Laterality,  Correct Procedure, Correct Position, site marked,  Risks and benefits discussed,  Surgical consent,  Pre-op evaluation,  At surgeon's request and post-op pain management  Laterality: Left  Prep: chloraprep       Needles:  Injection technique: Single-shot  Needle Type: Echogenic Stimulator Needle     Needle Length: 5cm  Needle Gauge: 22     Additional Needles:   Procedures:, nerve stimulator,,, ultrasound used (permanent image in chart),,     Nerve Stimulator or Paresthesia:  Response: hand, 0.45 mA  Additional Responses:   Narrative:  Start time: 10/28/2020 12:30 PM End time: 10/28/2020 12:34 PM Injection made incrementally with aspirations every 5 mL.  Performed by: Personally  Anesthesiologist: Bethena Midget, MD  Additional Notes: Functioning IV was confirmed and monitors were applied.  A 81mm 22ga Arrow echogenic stimulator needle was used. Sterile prep and drape,hand hygiene and sterile gloves were used. Ultrasound guidance: relevant anatomy identified, needle position confirmed, local anesthetic spread visualized around nerve(s)., vascular puncture avoided.  Image printed for medical record. Negative aspiration and negative test dose prior to incremental administration of local anesthetic. The patient tolerated the procedure well.

## 2020-10-28 NOTE — Interval H&P Note (Signed)
History and Physical Interval Note:  10/28/2020 1:00 PM  Frank Hensley  has presented today for surgery, with the diagnosis of Left shoulder rotator cuff tear.  The various methods of treatment have been discussed with the patient and family. After consideration of risks, benefits and other options for treatment, the patient has consented to  Procedure(s): SHOULDER ARTHROSCOPY,  SUBACROMIAL DECOMPRESSION, MINI OPEN ROTATOR CUFF REPAIR, POSSIBLE PATCH GRAFT (Left) as a surgical intervention.  The patient's history has been reviewed, patient examined, no change in status, stable for surgery.  I have reviewed the patient's chart and labs.  Questions were answered to the patient's satisfaction.     Javier Docker

## 2020-10-28 NOTE — Brief Op Note (Signed)
10/28/2020  3:44 PM  PATIENT:  Jori Moll  55 y.o. male  PRE-OPERATIVE DIAGNOSIS:  Left shoulder rotator cuff tear  POST-OPERATIVE DIAGNOSIS:  Left shoulder rotator cuff tear  PROCEDURE:  Procedure(s): SHOULDER ARTHROSCOPY,  SUBACROMIAL DECOMPRESSION, MINI OPEN ROTATOR CUFF REPAIR, POSSIBLE PATCH GRAFT (Left)  SURGEON:  Surgeon(s) and Role:    Jene Every, MD - Primary  PHYSICIAN ASSISTANT:   ASSISTANTSSu Hilt   ANESTHESIA:   general  EBL:  25 mL   BLOOD ADMINISTERED:none  DRAINS: none   LOCAL MEDICATIONS USED:  MARCAINE     SPECIMEN:  No Specimen  DISPOSITION OF SPECIMEN:  N/A  COUNTS:  YES  TOURNIQUET:  * No tourniquets in log *  DICTATION: .Other Dictation: Dictation Number   [id number]    PLAN OF CARE: Discharge to home after PACU  PATIENT DISPOSITION:  PACU - hemodynamically stable.   Delay start of Pharmacological VTE agent (>24hrs) due to surgical blood loss or risk of bleeding: no

## 2020-10-29 NOTE — Op Note (Signed)
NAME: Frank Hensley, Frank Hensley MEDICAL RECORD NO: [id number] ACCOUNT NO: [id number] DATE OF BIRTH: 1965-03-31 FACILITY: [facility] LOCATION: [facility] PHYSICIAN: Javier Docker, MD  Operative Report   DATE OF PROCEDURE: 10/28/2020  PREOPERATIVE DIAGNOSES:   1.  Retracted tear of the rotator cuff, left shoulder. 2.  Impingement syndrome, left shoulder. 3.  Partial tearing of the subscapularis.  POSTOPERATIVE DIAGNOSES:   1.  Retracted tear of the rotator cuff, left shoulder. 2.  Impingement syndrome, left shoulder. 3.  Partial tearing of the subscapularis.  PROCEDURE PERFORMED:   1.  Left shoulder arthroscopy with subacromial bursectomy, subdeltoid bursectomy. 2.  Mini open rotator cuff repair utilizing 4 SwiveLock suture anchors. 3.  Debridement of the subscapularis tendon.   ANESTHESIA:  General with regional block.   ASSISTANT: Skip Mayer, PA.  HISTORY: A 55 year old with retracted tear of the supraspinatus, partial tear of the subscap, bicipital tendinitis.  He was indicated for repair of the rotator cuff, possible biceps tenodesis, possible patch graft.  Risks and benefits discussed including  bleeding, infection, damage to neurovascular structures.  No change in symptoms, worsening symptoms, DVT, PE, anesthetic complications, etc.  TECHNIQUE:  The patient supine in beach chair position.  After induction of adequate general anesthesia and 3 grams Kefzol, left shoulder and upper extremity was prepped and draped in the usual sterile fashion.  The patient required an increased time to  position the patient due to his elevated BMI.  A surgical marker was utilized to delineate the acromion, AC joint and coracoid.  Due to his elevated BMI and the double strapping, had limited access to the shoulder somewhat.  We started out with a  posterolateral portal with incision through the skin only and inserted the arthroscopic camera and irrigant was utilized to insufflate the joint to 65 mmHg.  An  anterolateral portal was fashioned with a #11 blade triangulating the subacromial space.   There was hypertrophic bursa, a full-thickness tear of the rotator cuff.  I introduced a shaver in the lateral portal and performed a bursectomy subdeltoid and subacromial.  There was fraying of the anterior lateral aspect of the subscap and minor  tearing.  This was debrided.  The supraspinatus was retracted to the mid portion of the humeral head.  Lavaged and debrided the joint.  Biceps tendon was noted to be in its groove.  Rhona Raider was noted.  We then shaved the stump of the supraspinatus  tendon.  Following this, I decided to convert to a mini open repair.  All instrumentation was removed.  I closed the portals with 4-0 nylon simple sutures.  I then made a 3 cm incision over the anterolateral aspect of the acromion.  After infiltration  with 0.25% Marcaine with epinephrine, subcutaneous tissue was dissected. Electrocautery was utilized to achieve hemostasis.  The raphae between the anterolateral heads was divided in line of skin incision.  A self-retaining retractor was then placed.  Initially due to the patient's size, difficult to palpate.  I divided in line with the deltoid fibers more posteriorly. Felt this was too posterior.  I re-divided the deltoid at the raphae between the anterior and lateral heads more anteriorly.  The  acromion was rounded anterolaterally, making palpation at that point challenging due to his elevated BMI.  I placed a self-retaining retractor and we used a Cobb to enter the subacromial space.  I evacuated arthroscopic fluid.  We then proceeded with a  Beyer rongeur to decorticate lateral to the articular surface.  I mobilized the  cuff on its bursal and articular surface and we were able to mobilize it only to the lateral articular surface.  I then used a Beyer rongeur to remove the lateral 1 cm of the  articular surface and decorticate the bone, so that there would be at bed for the  supraspinatus tendon to be delivered to.  I placed two SwiveLock suture anchors approximately 1.5 cm apart at the lateral articular surface, piloting a hole with an awl,  inserting 2 SwiveLocks with TigerTape and with excellent resistance to pull out.  Two of these leaflets were passed medially and anteromedially with a Scorpion suture passer and 2 posteromedially and medially with a Scorpion suture passer.  These were  then crossed, the arm slightly abducted. We delivered the supraspinatus tendon into the decorticated bed of the greater tuberosity.  I then placed a second row of SwiveLocks after piloting a hole with an awl and inserting the SwiveLocks with the arm at  the side without undue tension.  We had delivered it to cancellous bone  with good coverage and the cuff was intact.  There was a small region of the infraspinatus torn posterolaterally.  I repaired this with 0 Vicryl interrupted figure-of-eight suture.   The remainder of the joint was unremarkable.  I copiously irrigated the wound.  A small acromioplasty was performed with subacromial approach.  Repaired the raphae with 1 Vicryl in interrupted figure-of-eight sutures, subcutaneous with 2-0 and skin with  Monocryl.  The patient was placed on abduction pillow, extubated without difficulty and transported to the recovery room in satisfactory condition.  The patient tolerated the procedure well.  No complications.  Assistant, Skip Mayer, PA.  Minimal blood loss, 25 mL  Noted the patient's subscap, which was debrided was attenuated, but not amenable to any further intervention.      PAA D: 10/28/2020 3:55:09 pm T: 10/29/2020 12:02:00 am  JOB: [id number]/ [id number]

## 2020-11-01 ENCOUNTER — Encounter (HOSPITAL_COMMUNITY): Payer: Self-pay | Admitting: Specialist

## 2020-11-01 NOTE — Addendum Note (Signed)
Addendum  created 11/01/20 1929 by Bethena Midget, MD   Clinical Note Signed, Intraprocedure Blocks edited, SmartForm saved
